# Patient Record
Sex: Male | Born: 1960 | Race: White | Hispanic: No | Marital: Married | Smoking: Never smoker
Health system: Southern US, Community
[De-identification: ages and names within clinical notes are randomized; demographics above are authoritative.]

## PROBLEM LIST (undated history)

## (undated) DIAGNOSIS — E119 Type 2 diabetes mellitus without complications: Secondary | ICD-10-CM

## (undated) DIAGNOSIS — I429 Cardiomyopathy, unspecified: Secondary | ICD-10-CM

## (undated) DIAGNOSIS — N19 Unspecified kidney failure: Secondary | ICD-10-CM

## (undated) DIAGNOSIS — I2510 Atherosclerotic heart disease of native coronary artery without angina pectoris: Secondary | ICD-10-CM

## (undated) DIAGNOSIS — N179 Acute kidney failure, unspecified: Secondary | ICD-10-CM

## (undated) DIAGNOSIS — N051 Unspecified nephritic syndrome with focal and segmental glomerular lesions: Secondary | ICD-10-CM

## (undated) DIAGNOSIS — G4730 Sleep apnea, unspecified: Secondary | ICD-10-CM

## (undated) DIAGNOSIS — IMO0002 Reserved for concepts with insufficient information to code with codable children: Secondary | ICD-10-CM

## (undated) DIAGNOSIS — I255 Ischemic cardiomyopathy: Secondary | ICD-10-CM

## (undated) DIAGNOSIS — I10 Essential (primary) hypertension: Secondary | ICD-10-CM

## (undated) DIAGNOSIS — E7800 Pure hypercholesterolemia, unspecified: Secondary | ICD-10-CM

## (undated) DIAGNOSIS — L723 Sebaceous cyst: Secondary | ICD-10-CM

## (undated) DIAGNOSIS — K2990 Gastroduodenitis, unspecified, without bleeding: Secondary | ICD-10-CM

## (undated) DIAGNOSIS — K2970 Gastritis, unspecified, without bleeding: Secondary | ICD-10-CM

## (undated) HISTORY — DX: Unspecified nephritic syndrome with focal and segmental glomerular lesions: N05.1

## (undated) HISTORY — DX: Sebaceous cyst: L72.3

## (undated) HISTORY — DX: Acute kidney failure, unspecified: N17.9

## (undated) HISTORY — DX: Gastroduodenitis, unspecified, without bleeding: K29.90

## (undated) HISTORY — DX: Cardiomyopathy, unspecified: I42.9

## (undated) HISTORY — DX: Type 2 diabetes mellitus without complications: E11.9

## (undated) HISTORY — DX: Pure hypercholesterolemia, unspecified: E78.00

## (undated) HISTORY — DX: Essential (primary) hypertension: I10

## (undated) HISTORY — DX: Sleep apnea, unspecified: G47.30

## (undated) HISTORY — DX: Unspecified kidney failure: N19

## (undated) HISTORY — DX: Atherosclerotic heart disease of native coronary artery without angina pectoris: I25.10

## (undated) HISTORY — DX: Ischemic cardiomyopathy: I25.5

## (undated) HISTORY — PX: CORONARY STENT PLACEMENT: SHX1402

## (undated) HISTORY — DX: Gastritis, unspecified, without bleeding: K29.70

## (undated) HISTORY — DX: Reserved for concepts with insufficient information to code with codable children: IMO0002

---

## 1996-06-05 DIAGNOSIS — E119 Type 2 diabetes mellitus without complications: Secondary | ICD-10-CM

## 1996-06-05 HISTORY — DX: Type 2 diabetes mellitus without complications: E11.9

## 1998-06-05 HISTORY — PX: UVULOPALATOPHARYNGOPLASTY (UPPP)/TONSILLECTOMY/SEPTOPLASTY: SHX6164

## 2011-06-06 DIAGNOSIS — N179 Acute kidney failure, unspecified: Secondary | ICD-10-CM

## 2011-06-06 DIAGNOSIS — IMO0002 Reserved for concepts with insufficient information to code with codable children: Secondary | ICD-10-CM

## 2011-06-06 HISTORY — PX: ACNE CYST REMOVAL: SUR1112

## 2011-06-06 HISTORY — PX: CARDIAC CATHETERIZATION: SHX172

## 2011-06-06 HISTORY — DX: Acute kidney failure, unspecified: N17.9

## 2011-06-06 HISTORY — DX: Reserved for concepts with insufficient information to code with codable children: IMO0002

## 2011-09-28 ENCOUNTER — Encounter: Payer: Self-pay | Admitting: *Deleted

## 2011-10-03 ENCOUNTER — Ambulatory Visit (INDEPENDENT_AMBULATORY_CARE_PROVIDER_SITE_OTHER): Payer: 59 | Admitting: Cardiovascular Disease

## 2011-10-03 ENCOUNTER — Encounter: Payer: Self-pay | Admitting: Cardiovascular Disease

## 2011-10-03 VITALS — BP 152/90 | HR 55 | Ht 74.0 in | Wt 289.0 lb

## 2011-10-03 DIAGNOSIS — I10 Essential (primary) hypertension: Secondary | ICD-10-CM | POA: Insufficient documentation

## 2011-10-03 DIAGNOSIS — G4730 Sleep apnea, unspecified: Secondary | ICD-10-CM | POA: Insufficient documentation

## 2011-10-03 DIAGNOSIS — I2510 Atherosclerotic heart disease of native coronary artery without angina pectoris: Secondary | ICD-10-CM | POA: Insufficient documentation

## 2011-10-03 DIAGNOSIS — E7800 Pure hypercholesterolemia, unspecified: Secondary | ICD-10-CM | POA: Insufficient documentation

## 2011-10-03 DIAGNOSIS — R0600 Dyspnea, unspecified: Secondary | ICD-10-CM

## 2011-10-03 MED ORDER — SPIRONOLACTONE 25 MG PO TABS: 25.0000 mg | ORAL_TABLET | Freq: Every day | ORAL | Status: DC

## 2011-10-03 MED ORDER — CARVEDILOL 12.5 MG PO TABS: 12.5000 mg | ORAL_TABLET | Freq: Two times a day (BID) | ORAL | Status: DC

## 2011-10-03 NOTE — Assessment & Plan Note (Signed)
The patient reports mild dyspnea with lower extremity edema. I suspect that he has some degree of diastolic dysfunction. He has a left bundle branch block on his EKG which is likely old. I will request an echocardiogram for further evaluation.

## 2011-10-03 NOTE — Assessment & Plan Note (Signed)
Patient seems to be stable overall with no evidence of angina. I will request his cardiac records from his previous cardiologist in [place].  Continue current medical therapy.

## 2011-10-03 NOTE — Assessment & Plan Note (Signed)
Based on his clinical symptoms, the patient likely has sleep apnea. This is likely contributing to his uncontrolled hypertension and diastolic dysfunction. Consider evaluation with a sleep study in the near future.

## 2011-10-03 NOTE — Progress Notes (Signed)
HPI  This is a 51 year old male who is here today to establish cardiovascular care. He moved recently from [place]. He got married recently and his wife is from [place]. He has extensive medical problems that include coronary artery disease. He had 2 previous stents done most recently was 6 or 7 years ago. No cardiac events since then. It appears that he continued to have angina and has been treated with Ranexa. He also has history of type 2 diabetes and prolonged history of hypertension which according to him was diagnosed when he was 51 years old. He has strong family history of hypertension and diabetes. He also has hyperlipidemia and obesity. He was diagnosed with sleep apnea in the past but he had surgery done to correct that. He has not been tested for this recently. He denies any chest pain. However, he does complain of exertional dyspnea and lower extremity edema. The edema became noticeable over the last 2 years when he was started on minoxidil. According to his wife, he snores loudly at night and his breathing stops for a few seconds. The patient has been working on weight loss and lost about 30 pounds over the last 6 months.  No Known Allergies   Current Outpatient Prescriptions on File Prior to Visit  Medication Sig Dispense Refill  . amLODipine (NORVASC) 10 MG tablet Take 10 mg by mouth daily.      Marland Kitchen aspirin 81 MG tablet Take 81 mg by mouth daily.      . clopidogrel (PLAVIX) 75 MG tablet Take 75 mg by mouth daily.      . furosemide (LASIX) 80 MG tablet Take 80 mg by mouth daily.      Marland Kitchen glipiZIDE (GLUCOTROL XL) 2.5 MG 24 hr tablet Take 2.5 mg by mouth daily.      . isosorbide mononitrate (IMDUR) 30 MG 24 hr tablet Take 30 mg by mouth daily.      Marland Kitchen lisinopril (PRINIVIL,ZESTRIL) 40 MG tablet Take 40 mg by mouth daily.      Marland Kitchen losartan (COZAAR) 100 MG tablet Take 100 mg by mouth daily.      . metFORMIN (GLUCOPHAGE) 500 MG tablet Take 500 mg by mouth 2 (two) times daily with a  meal.      . potassium chloride SA (K-DUR,KLOR-CON) 20 MEQ tablet Take 20 mEq by mouth daily.       . pravastatin (PRAVACHOL) 40 MG tablet Take 80 mg by mouth daily.       . ranitidine (ZANTAC) 150 MG tablet Take 150 mg by mouth 2 (two) times daily.      . ranolazine (RANEXA) 500 MG 12 hr tablet Take 500 mg by mouth 2 (two) times daily.      . carvedilol (COREG) 12.5 MG tablet Take 1 tablet (12.5 mg total) by mouth 2 (two) times daily.  60 tablet  1  . spironolactone (ALDACTONE) 25 MG tablet Take 1 tablet (25 mg total) by mouth daily.  30 tablet  1     Past Medical History  Diagnosis Date  . Unspecified gastritis and gastroduodenitis without mention of hemorrhage   . Sebaceous cyst     multiple on face, scalp, and trunk (side, left) per Dr Juanetta Gosling 09/25/11, Referral to [facility]  . DM type 2 (diabetes mellitus, type 2) 1998  . Coronary artery disease     2 previous stent. Most recent was in 2006  . Hypercholesterolemia   . HTN (hypertension)  since he was 51 y/o ?!!  . Sleep apnea     s/p ENT surgery more than 10 years ago.     Past Surgical History  Procedure Date  . Coronary stent placement 2002, 2006    coronary artery stent  . Cardiac catheterization      Family History  Problem Relation Age of Onset  . Heart disease Father   . Heart disease Brother      History   Social History  . Marital Status: Married    Spouse Name: N/A    Number of Children: N/A  . Years of Education: N/A   Occupational History  . Not on file.   Social History Main Topics  . Smoking status: Never Smoker   . Smokeless tobacco: Current User  . Alcohol Use: Yes     once or twice weekly  . Drug Use: No  . Sexually Active: Not on file   Other Topics Concern  . Not on file   Social History Narrative  . No narrative on file     ROS Constitutional: Negative for fever, chills, diaphoresis, activity change, appetite change.  HENT: Negative for hearing loss, nosebleeds,  congestion, sore throat, facial swelling, drooling, trouble swallowing, neck pain, voice change, sinus pressure and tinnitus.  Eyes: Negative for photophobia, pain, discharge and visual disturbance.  Respiratory: Negative for apnea, cough, chest tightness and wheezing.  Cardiovascular: Negative for chest pain, palpitations.  Gastrointestinal: Negative for nausea, vomiting, abdominal pain, diarrhea, constipation, blood in stool and abdominal distention.  Genitourinary: Negative for dysuria, urgency, frequency, hematuria and decreased urine volume.  Musculoskeletal: Negative for myalgias, back pain, joint swelling, arthralgias and gait problem.  Skin: Negative for color change, pallor, rash and wound.  Neurological: Negative for dizziness, tremors, seizures, syncope, speech difficulty, weakness, light-headedness, numbness and headaches.  Psychiatric/Behavioral: Negative for suicidal ideas, hallucinations, behavioral problems and agitation. The patient is not nervous/anxious.     PHYSICAL EXAM   BP 152/90  Pulse 55  Ht 6\' 2"  (1.88 m)  Wt 289 lb (131.09 kg)  BMI 37.11 kg/m2 Constitutional: He is oriented to person, place, and time. He appears well-developed and well-nourished. No distress.  HENT: No nasal discharge.  Head: Normocephalic and atraumatic.  Eyes: Pupils are equal and round. Right eye exhibits no discharge. Left eye exhibits no discharge.  Neck: Normal range of motion. Neck supple. No JVD present. No thyromegaly present.  Cardiovascular: Normal rate, regular rhythm, normal heart sounds and. Exam reveals no gallop and no friction rub. No murmur heard.  Pulmonary/Chest: Effort normal and breath sounds normal. No stridor. No respiratory distress. He has no wheezes. He has no rales. He exhibits no tenderness.  Abdominal: Soft. Bowel sounds are normal. He exhibits no distension. There is no tenderness. There is no rebound and no guarding.  Musculoskeletal: Normal range of motion. He  exhibits +1 edema and no tenderness.  Neurological: He is alert and oriented to person, place, and time. Coordination normal.  Skin: Skin is warm and dry. No rash noted. He is not diaphoretic. No erythema. No pallor.  Psychiatric: He has a normal mood and affect. His behavior is normal. Judgment and thought content normal.       EKG: Sinus bradycardia with left bundle branch block.   ASSESSMENT AND PLAN

## 2011-10-03 NOTE — Assessment & Plan Note (Signed)
His recent lipid profile showed a total cholesterol of 201, triglyceride 105, HDL 37 and LDL of 143. It appears that since then he was started on pravastatin which was subsequently increased to 80 mg once daily. Due to his known history of coronary artery disease, I recommend a target LDL is less than 70.

## 2011-10-03 NOTE — Assessment & Plan Note (Signed)
The patient seems to have refractory hypertension in spite of multiple medications. I think in his situation there are 2 conditions that would need to be evaluated as a culprit for his elevated blood pressure. First is renal artery stenosis and second is untreated sleep apnea.  Thus, I recommend renal duplex ultrasound. He would also need to be referred for a sleep study in the near future. Meanwhile, there are some opportunities to make changes in his medications. Minoxidil is likely responsible for his lower extremity edema and fluid retention in the setting of treatment with amlodipine. Also, metoprolol does not usually have a good antihypertensive effect and does not have a favorable metabolic profile in diabetics. Due to that, I recommend stopping minoxidil and metoprolol. Instead, I will switch him to carvedilol 12.5 mg twice daily and add spironolactone 25 mg once daily. I will obtain basic metabolic profile in one week. I asked him to decrease potassium supplements to once a day. Once his fluid retention improves, we might consider switching furosemide to a thiazide diuretic.

## 2011-10-03 NOTE — Patient Instructions (Addendum)
Stop taking Minoxidil Stop Metoprolol.  Decrease Potassium to once daily  Needs labs done in 1 week.  Start Carvedilol (Coreg) 12.5 mg twice daily.  Start Spironolactone (Aldactone) 25 mg once daily.   Your physician has requested that you have an echocardiogram. Echocardiography is a painless test that uses sound waves to create images of your heart. It provides your doctor with information about the size and shape of your heart and how well your heart's chambers and valves are working. This procedure takes approximately one hour. There are no restrictions for this procedure.  Your physician has requested that you have a renal artery duplex. During this test, an ultrasound is used to evaluate blood flow to the kidneys. Allow one hour for this exam. Do not eat after midnight the day before and avoid carbonated beverages. Take your medications as you usually do.  Follow up after tests.

## 2011-10-11 ENCOUNTER — Other Ambulatory Visit (INDEPENDENT_AMBULATORY_CARE_PROVIDER_SITE_OTHER): Payer: 59

## 2011-10-11 ENCOUNTER — Other Ambulatory Visit: Payer: Self-pay

## 2011-10-11 DIAGNOSIS — R0602 Shortness of breath: Secondary | ICD-10-CM

## 2011-10-11 DIAGNOSIS — I10 Essential (primary) hypertension: Secondary | ICD-10-CM

## 2011-10-11 DIAGNOSIS — I059 Rheumatic mitral valve disease, unspecified: Secondary | ICD-10-CM

## 2011-10-24 ENCOUNTER — Other Ambulatory Visit: Payer: 59

## 2011-11-07 ENCOUNTER — Other Ambulatory Visit (INDEPENDENT_AMBULATORY_CARE_PROVIDER_SITE_OTHER): Payer: 59

## 2011-11-07 ENCOUNTER — Encounter (INDEPENDENT_AMBULATORY_CARE_PROVIDER_SITE_OTHER): Payer: 59

## 2011-11-07 DIAGNOSIS — I10 Essential (primary) hypertension: Secondary | ICD-10-CM

## 2011-11-07 DIAGNOSIS — I2510 Atherosclerotic heart disease of native coronary artery without angina pectoris: Secondary | ICD-10-CM

## 2011-11-08 LAB — BASIC METABOLIC PANEL
Calcium: 9.8 mg/dL (ref 8.7–10.2)
GFR calc Af Amer: 63 mL/min/{1.73_m2} (ref >59)
GFR calc non Af Amer: 54 mL/min/{1.73_m2} — ABNORMAL LOW (ref >59)
Potassium: 5.2 mmol/L (ref 3.5–5.2)
Sodium: 137 mmol/L (ref 134–144)

## 2011-11-13 ENCOUNTER — Encounter: Payer: Self-pay | Admitting: Cardiovascular Disease

## 2011-11-13 ENCOUNTER — Ambulatory Visit (INDEPENDENT_AMBULATORY_CARE_PROVIDER_SITE_OTHER): Payer: 59 | Admitting: Cardiovascular Disease

## 2011-11-13 VITALS — BP 110/80 | HR 76 | Ht 74.0 in | Wt 276.0 lb

## 2011-11-13 DIAGNOSIS — I2510 Atherosclerotic heart disease of native coronary artery without angina pectoris: Secondary | ICD-10-CM

## 2011-11-13 DIAGNOSIS — R0600 Dyspnea, unspecified: Secondary | ICD-10-CM

## 2011-11-13 DIAGNOSIS — I10 Essential (primary) hypertension: Secondary | ICD-10-CM

## 2011-11-13 NOTE — Patient Instructions (Addendum)
Stop taking Lasix (Furosemide). If you develop fluid build up, resume Furosemide 1/2 tablet (40 mg ) once daily.  Decrease Losartan to 1/2 tablet (50 mg) once daily .  You need to have labs done in 1 week to check kidney function.   Follow up in 1 month.

## 2011-11-16 NOTE — Assessment & Plan Note (Signed)
His blood pressure improved after recent changes in his medications. He is mildly dizzy and his labs showed mild volume depletion. Thus, I asked him to stop taking Lasix and monitor his lower extremity edema. If he develops recurrent fluid retention, Lasix can be resumed at a smaller dose 40 mg once daily. He is also on both ACE inhibitor and an ARB with mild hyperkalemia at 5.2. He is also on spironolactone. Thus, I will decrease the dose of losartan 50 mg once daily. The next step would be to stop losartan altogether and increase carvedilol if further blood pressure control is needed. His renal duplex ultrasound showed no evidence of renal artery stenosis.

## 2011-11-16 NOTE — Progress Notes (Signed)
HPI  This is a 51 year old male who is here for a followup visit. He has extensive medical problems that include coronary artery disease. He had 2 previous stents done most recently was 6 or 7 years ago. No cardiac events since then. It appears that he continued to have angina and has been treated with Ranexa. He also has history of type 2 diabetes and prolonged history of hypertension which according to him was diagnosed when he was 51 years old. He has strong family history of hypertension and diabetes. He also has hyperlipidemia and obesity. He was diagnosed with sleep apnea in the past but he had surgery done to correct that. He has not been tested for this recently. He denies any chest pain.  During his last visit, he was noted to have significant lower extremity edema in spite of being on high-dose Lasix. I made some changes to his medications that include stopping minoxidil, switching metoprolol to carvedilol and adding Aldactone. He lost 13 pounds since then with significant improvement in his lower extremity edema. His dyspnea has improved but does complain of increased dizziness especially when he stands up.  No Known Allergies   Current Outpatient Prescriptions on File Prior to Visit  Medication Sig Dispense Refill  . amLODipine (NORVASC) 10 MG tablet Take 10 mg by mouth daily.      Marland Kitchen aspirin 81 MG tablet Take 81 mg by mouth daily.      . carvedilol (COREG) 12.5 MG tablet Take 1 tablet (12.5 mg total) by mouth 2 (two) times daily.  60 tablet  1  . clopidogrel (PLAVIX) 75 MG tablet Take 75 mg by mouth daily.      . furosemide (LASIX) 80 MG tablet Take 80 mg by mouth daily.      Marland Kitchen glipiZIDE (GLUCOTROL XL) 2.5 MG 24 hr tablet Take 2.5 mg by mouth daily.      . isosorbide mononitrate (IMDUR) 30 MG 24 hr tablet Take 30 mg by mouth daily.      Marland Kitchen lisinopril (PRINIVIL,ZESTRIL) 40 MG tablet Take 40 mg by mouth daily.      Marland Kitchen losartan (COZAAR) 100 MG tablet Take 100 mg by mouth daily.      .  metFORMIN (GLUCOPHAGE) 500 MG tablet Take 500 mg by mouth 2 (two) times daily with a meal.      . potassium chloride SA (K-DUR,KLOR-CON) 20 MEQ tablet Take 20 mEq by mouth daily.       . ranitidine (ZANTAC) 150 MG tablet Take 150 mg by mouth 2 (two) times daily.      . ranolazine (RANEXA) 500 MG 12 hr tablet Take 500 mg by mouth 2 (two) times daily.      Marland Kitchen spironolactone (ALDACTONE) 25 MG tablet Take 1 tablet (25 mg total) by mouth daily.  30 tablet  1     Past Medical History  Diagnosis Date  . Unspecified gastritis and gastroduodenitis without mention of hemorrhage   . Sebaceous cyst     multiple on face, scalp, and trunk (side, left) per Dr Juanetta Gosling 09/25/11, Referral to [facility]  . DM type 2 (diabetes mellitus, type 2) 1998  . Coronary artery disease     2 previous stent. Most recent was in 2006  . Hypercholesterolemia   . HTN (hypertension)     since he was 51 y/o ?!!  . Sleep apnea     s/p ENT surgery more than 10 years ago.  . Cyst 2013  Removed from scalp & eye     Past Surgical History  Procedure Date  . Coronary stent placement 2002, 2006    coronary artery stent  . Cardiac catheterization      Family History  Problem Relation Age of Onset  . Heart disease Father   . Heart disease Brother      History   Social History  . Marital Status: Married    Spouse Name: N/A    Number of Children: N/A  . Years of Education: N/A   Occupational History  . Not on file.   Social History Main Topics  . Smoking status: Never Smoker   . Smokeless tobacco: Current User    Types: Chew  . Alcohol Use: Yes     once or twice weekly  . Drug Use: No  . Sexually Active: Not on file   Other Topics Concern  . Not on file   Social History Narrative  . No narrative on file      PHYSICAL EXAM   BP 110/80  Pulse 76  Ht 6\' 2"  (1.88 m)  Wt 276 lb (125.193 kg)  BMI 35.44 kg/m2 Constitutional: He is oriented to person, place, and time. He appears  well-developed and well-nourished. No distress.  HENT: No nasal discharge.  Head: Normocephalic and atraumatic.  Eyes: Pupils are equal and round. Right eye exhibits no discharge. Left eye exhibits no discharge.  Neck: Normal range of motion. Neck supple. No JVD present. No thyromegaly present.  Cardiovascular: Normal rate, regular rhythm, normal heart sounds and. Exam reveals no gallop and no friction rub. No murmur heard.  Pulmonary/Chest: Effort normal and breath sounds normal. No stridor. No respiratory distress. He has no wheezes. He has no rales. He exhibits no tenderness.  Abdominal: Soft. Bowel sounds are normal. He exhibits no distension. There is no tenderness. There is no rebound and no guarding.  Musculoskeletal: Normal range of motion. He exhibits trace edema and no tenderness.  Neurological: He is alert and oriented to person, place, and time. Coordination normal.  Skin: Skin is warm and dry. No rash noted. He is not diaphoretic. No erythema. No pallor.  Psychiatric: He has a normal mood and affect. His behavior is normal. Judgment and thought content normal.       ASSESSMENT AND PLAN

## 2011-11-16 NOTE — Assessment & Plan Note (Signed)
No symptoms suggestive of angina. Continue medical therapy. 

## 2011-11-16 NOTE — Assessment & Plan Note (Signed)
His dyspnea has improved after recent adjustment in his medications. Echocardiogram showed mildly dilated left ventricle with normal LV systolic function with mild mitral and aortic insufficiency.  A mitral chordal rupture could not be excluded but there was only mild regurgitation. Thus, this will be monitored for now with likely repeat echocardiogram in one year.

## 2011-11-20 ENCOUNTER — Other Ambulatory Visit (INDEPENDENT_AMBULATORY_CARE_PROVIDER_SITE_OTHER): Payer: 59

## 2011-11-20 DIAGNOSIS — I10 Essential (primary) hypertension: Secondary | ICD-10-CM

## 2011-11-21 LAB — BASIC METABOLIC PANEL
GFR calc Af Amer: 80 mL/min/{1.73_m2} (ref >59)
GFR calc non Af Amer: 69 mL/min/{1.73_m2} (ref >59)
Potassium: 4.8 mmol/L (ref 3.5–5.2)
Sodium: 133 mmol/L — ABNORMAL LOW (ref 134–144)

## 2011-11-28 ENCOUNTER — Other Ambulatory Visit: Payer: Self-pay | Admitting: Cardiovascular Disease

## 2011-11-28 MED ORDER — SPIRONOLACTONE 25 MG PO TABS: 25.0000 mg | ORAL_TABLET | Freq: Every day | ORAL | Status: DC

## 2011-11-28 MED ORDER — CARVEDILOL 12.5 MG PO TABS: 12.5000 mg | ORAL_TABLET | Freq: Two times a day (BID) | ORAL | Status: DC

## 2011-11-28 NOTE — Telephone Encounter (Signed)
PLEASE ONLY SEND A 30 day in to [facility]

## 2011-11-28 NOTE — Telephone Encounter (Signed)
Refilled Carvedilol and spironolactone.

## 2011-12-18 ENCOUNTER — Ambulatory Visit: Payer: 59 | Admitting: Cardiovascular Disease

## 2011-12-19 ENCOUNTER — Ambulatory Visit: Payer: 59 | Admitting: Cardiovascular Disease

## 2011-12-23 ENCOUNTER — Telehealth: Payer: Self-pay | Admitting: Physician Assistant

## 2011-12-23 ENCOUNTER — Inpatient Hospital Stay (HOSPITAL_COMMUNITY)
Admission: EM | Admit: 2011-12-23 | Discharge: 2011-12-25 | DRG: 683 | Disposition: A | Discharge status: Home / Self Care | Payer: 59 | Attending: Internal Medicine | Admitting: Internal Medicine

## 2011-12-23 ENCOUNTER — Encounter (HOSPITAL_COMMUNITY): Payer: Self-pay | Admitting: Physical Medicine and Rehabilitation

## 2011-12-23 DIAGNOSIS — Z7982 Long term (current) use of aspirin: Secondary | ICD-10-CM

## 2011-12-23 DIAGNOSIS — E7800 Pure hypercholesterolemia, unspecified: Secondary | ICD-10-CM | POA: Diagnosis present

## 2011-12-23 DIAGNOSIS — N19 Unspecified kidney failure: Secondary | ICD-10-CM | POA: Diagnosis present

## 2011-12-23 DIAGNOSIS — E785 Hyperlipidemia, unspecified: Secondary | ICD-10-CM | POA: Diagnosis present

## 2011-12-23 DIAGNOSIS — I10 Essential (primary) hypertension: Secondary | ICD-10-CM | POA: Diagnosis present

## 2011-12-23 DIAGNOSIS — N179 Acute kidney failure, unspecified: Principal | ICD-10-CM | POA: Diagnosis present

## 2011-12-23 DIAGNOSIS — E119 Type 2 diabetes mellitus without complications: Secondary | ICD-10-CM | POA: Diagnosis present

## 2011-12-23 DIAGNOSIS — E871 Hypo-osmolality and hyponatremia: Secondary | ICD-10-CM | POA: Diagnosis present

## 2011-12-23 DIAGNOSIS — K2990 Gastroduodenitis, unspecified, without bleeding: Secondary | ICD-10-CM | POA: Diagnosis present

## 2011-12-23 DIAGNOSIS — Z79899 Other long term (current) drug therapy: Secondary | ICD-10-CM

## 2011-12-23 DIAGNOSIS — E875 Hyperkalemia: Secondary | ICD-10-CM | POA: Diagnosis present

## 2011-12-23 DIAGNOSIS — K2970 Gastritis, unspecified, without bleeding: Secondary | ICD-10-CM | POA: Diagnosis present

## 2011-12-23 DIAGNOSIS — I2510 Atherosclerotic heart disease of native coronary artery without angina pectoris: Secondary | ICD-10-CM | POA: Diagnosis present

## 2011-12-23 LAB — URINALYSIS, MICROSCOPIC ONLY
Bilirubin Urine: NEGATIVE
Glucose, UA: NEGATIVE mg/dL
Hgb urine dipstick: NEGATIVE
Ketones, ur: NEGATIVE mg/dL
Leukocytes, UA: NEGATIVE
Nitrite: NEGATIVE
Protein, ur: 30 mg/dL — AB
Specific Gravity, Urine: 1.02 (ref 1.005–1.030)
Urobilinogen, UA: 0.2 mg/dL (ref 0.0–1.0)
pH: 5 (ref 5.0–8.0)

## 2011-12-23 LAB — CBC
HCT: 38.7 % — ABNORMAL LOW (ref 39.0–52.0)
MCH: 30.4 pg (ref 26.0–34.0)
MCHC: 36.2 g/dL — ABNORMAL HIGH (ref 30.0–36.0)
MCV: 83.9 fL (ref 78.0–100.0)
RDW: 13 % (ref 11.5–15.5)

## 2011-12-23 LAB — COMPREHENSIVE METABOLIC PANEL
Albumin: 4.6 g/dL (ref 3.5–5.2)
BUN: 85 mg/dL — ABNORMAL HIGH (ref 6–23)
Creatinine, Ser: 4.87 mg/dL — ABNORMAL HIGH (ref 0.50–1.35)
Total Protein: 8.6 g/dL — ABNORMAL HIGH (ref 6.0–8.3)

## 2011-12-23 LAB — BASIC METABOLIC PANEL
BUN: 83 mg/dL — ABNORMAL HIGH (ref 6–23)
CO2: 18 mEq/L — ABNORMAL LOW (ref 19–32)
GFR calc non Af Amer: 16 mL/min — ABNORMAL LOW (ref >90)
Glucose, Bld: 120 mg/dL — ABNORMAL HIGH (ref 70–99)
Potassium: 4.4 mEq/L (ref 3.5–5.1)
Sodium: 135 mEq/L (ref 135–145)

## 2011-12-23 LAB — CARDIAC PANEL(CRET KIN+CKTOT+MB+TROPI)
Relative Index: INVALID (ref 0.0–2.5)
Troponin I: <0.3 ng/mL (ref <0.30)

## 2011-12-23 LAB — UREA NITROGEN, URINE: Urea Nitrogen, Ur: 746 mg/dL

## 2011-12-23 LAB — CREATININE, URINE, RANDOM: Creatinine, Urine: 257.86 mg/dL

## 2011-12-23 LAB — GLUCOSE, CAPILLARY: Glucose-Capillary: 138 mg/dL — ABNORMAL HIGH (ref 70–99)

## 2011-12-23 MED ORDER — SODIUM CHLORIDE 0.9 % IV BOLUS (SEPSIS)
500.0000 mL | Freq: Once | INTRAVENOUS | Status: AC
  Administered 2011-12-23: 500 mL via INTRAVENOUS

## 2011-12-23 MED ORDER — SODIUM CHLORIDE 0.9 % IV SOLN
INTRAVENOUS | Status: DC
  Administered 2011-12-23: 16:00:00 via INTRAVENOUS

## 2011-12-23 MED ORDER — SIMVASTATIN 40 MG PO TABS
40.0000 mg | ORAL_TABLET | Freq: Every day | ORAL | Status: DC
  Filled 2011-12-23: qty 1

## 2011-12-23 MED ORDER — SIMVASTATIN 20 MG PO TABS
20.0000 mg | ORAL_TABLET | Freq: Every day | ORAL | Status: DC
  Filled 2011-12-23: qty 1

## 2011-12-23 MED ORDER — SODIUM CHLORIDE 0.9 % IJ SOLN
3.0000 mL | Freq: Two times a day (BID) | INTRAMUSCULAR | Status: DC
  Administered 2011-12-25: 3 mL via INTRAVENOUS

## 2011-12-23 MED ORDER — HEPARIN SODIUM (PORCINE) 5000 UNIT/ML IJ SOLN
5000.0000 [IU] | Freq: Three times a day (TID) | INTRAMUSCULAR | Status: DC
  Administered 2011-12-23 – 2011-12-25 (×5): 5000 [IU] via SUBCUTANEOUS
  Filled 2011-12-23 (×8): qty 1

## 2011-12-23 MED ORDER — AMLODIPINE BESYLATE 10 MG PO TABS
10.0000 mg | ORAL_TABLET | Freq: Every day | ORAL | Status: DC
  Administered 2011-12-23 – 2011-12-25 (×3): 10 mg via ORAL
  Filled 2011-12-23 (×3): qty 1

## 2011-12-23 MED ORDER — RANOLAZINE ER 500 MG PO TB12: 500.0000 mg | ORAL_TABLET | Freq: Two times a day (BID) | ORAL | Status: DC

## 2011-12-23 MED ORDER — INSULIN ASPART 100 UNIT/ML ~~LOC~~ SOLN
0.0000 [IU] | Freq: Three times a day (TID) | SUBCUTANEOUS | Status: DC
  Administered 2011-12-24: 2 [IU] via SUBCUTANEOUS
  Administered 2011-12-24 – 2011-12-25 (×3): 1 [IU] via SUBCUTANEOUS

## 2011-12-23 MED ORDER — CARVEDILOL 12.5 MG PO TABS
12.5000 mg | ORAL_TABLET | Freq: Two times a day (BID) | ORAL | Status: DC
  Administered 2011-12-23 – 2011-12-25 (×4): 12.5 mg via ORAL
  Filled 2011-12-23 (×5): qty 1

## 2011-12-23 MED ORDER — SODIUM CHLORIDE 0.9 % IV SOLN
INTRAVENOUS | Status: AC
  Administered 2011-12-23: 22:00:00 via INTRAVENOUS

## 2011-12-23 MED ORDER — CLOPIDOGREL BISULFATE 75 MG PO TABS
75.0000 mg | ORAL_TABLET | Freq: Every day | ORAL | Status: DC
  Administered 2011-12-23 – 2011-12-25 (×3): 75 mg via ORAL
  Filled 2011-12-23 (×3): qty 1

## 2011-12-23 NOTE — ED Notes (Signed)
Pt presents to department for evaluation of elevated potassium of 7.2 and BUN of 84, sent from PCP for evaluation. Pt states he has been feeling very weak and run down. Also states hypotension and dizziness. Denies chest pain. Respirations unlabored. He is conscious alert and oriented x4.

## 2011-12-23 NOTE — H&P (Signed)
Date: 12/23/2011               Patient Name:  Samuel Navarro MRN: [id number]  DOB: 09-21-1960 Age / Sex: 51 y.o., male   PCP: Park Pope              Medical Service: Internal Medicine Teaching Service              Attending Physician: Dr. Rogelia Boga    First Contact: Dr. Lavena Bullion Pager: [phone]  Second Contact: Dr. Saralyn Pilar Pager: [phone]            After Hours (After 5p/  First Contact Pager: [phone]  weekends / holidays): Second Contact Pager: [phone]     Chief Complaint: Weakness and lightheadedness, abnormal labs  History of Present Illness: Patient is a 51 y.o. male with a PMHx of CAD s/p stenting (6-7 years ago), DMII well controlled with last A1c 6.2, HTN (which apparently was diagnosed at age 78), and recent intentional weight loss of 50 lbs (since January) who presents to [facility] for evaluation of 2 week history of progressively worsening generalized weakness, positional lightheadedness, specifically worsened over the last several days prior to admission. 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 This job his requiring the patient to be outside in the sun, sweating more frequently (he even is having to change his clothes during the shifts due to extent of the perspiration). Due to the hectic nature of his job, the patient is not eating or drinking regularly. He has been experiencing positional lightheadedness going from seated to standing positions as is required for his job. The patient is on a significant regimen of cardiac medications as an outpatient, including an ACE inhibitor, ARB, spironolactone, Ranexa, and Imdur. He has continued all of the above medications over the last 2 weeks despite his symptomatology. He was also previously on Lasix, which was discontinued on 10/03/2011 by his primary cardiologist in  the setting of orthostatic hypotension. On 11/13/2011, the patient's cardiologist noted him to have mildly elevated potassium level of 5.2, secondary to which the patient was instructed to decrease his losartan from 100 mg daily to 50 mg daily. He was instructed to return for repeat labs, however did not do so until the day prior to admission.  On 12/22/2011, the patient had repeat BMET performed at his PCPs office, apparently, this BMET revealed a potassium of 7.2 and BUN of 84 (from prior K. of 5.2, creatinine of 1.2 on 11/20/2011) secondary to which he was instructed to report to the ER for further evaluation. On evaluation of the [facility] ER, the patient's potassium level is 5, and creatinine is elevated at 4.87.   The patient continues to make urine, although it is darker in the less volume. He otherwise denies chest pain, palpitations, nausea, vomiting, diarrhea, abdominal pain, fevers, chills.   Review of Systems: Constitutional:  denies fever, chills, diaphoresis. Admits to decreased oral intake and increased fatigue.  HEENT: denies hearing loss, ear pain, congestion, sore throat, rhinorrhea, sneezing, neck pain, neck stiffness and tinnitus.  Respiratory: denies SOB, DOE, cough, chest tightness, and wheezing.  Cardiovascular: denies chest pain, palpitations and leg swelling.  Gastrointestinal: denies nausea, vomiting, abdominal pain, diarrhea, constipation, blood in stool.  Genitourinary: denies dysuria, urgency, frequency, hematuria, flank pain and  difficulty urinating.  Musculoskeletal: denies myalgias, joint swelling, arthralgias and gait problem.  Admits to low back pain  Skin: denies pallor, rash and wound.  Neurological: denies dizziness, seizures, syncope, focal weakness, numbness and headaches.  Admits to positional lightheadedness.  Hematological: admits to increased bruising over last 2 weeks. Denies adenopathy, personal or family bleeding history.    Current Outpatient  Medications: Medication Sig  . amLODipine (NORVASC) 10 MG tablet Take 10 mg by mouth daily.  Marland Kitchen aspirin 81 MG tablet Take 81 mg by mouth daily.  . carvedilol (COREG) 12.5 MG tablet Take 1 tablet (12.5 mg total) by mouth 2 (two) times daily.  . clopidogrel (PLAVIX) 75 MG tablet Take 75 mg by mouth daily.  Marland Kitchen glipiZIDE (GLUCOTROL XL) 2.5 MG 24 hr tablet Take 2.5 mg by mouth daily.  . isosorbide mononitrate (IMDUR) 30 MG 24 hr tablet Take 30 mg by mouth daily.  Marland Kitchen lisinopril (PRINIVIL,ZESTRIL) 40 MG tablet Take 40 mg by mouth daily.  Marland Kitchen losartan (COZAAR) 100 MG tablet Take 50 mg by mouth daily.   . metFORMIN (GLUCOPHAGE-XR) 500 MG 24 hr tablet Take 500 mg by mouth 2 (two) times daily.  . potassium chloride SA (K-DUR,KLOR-CON) 20 MEQ tablet Take 20 mEq by mouth daily.   . pravastatin (PRAVACHOL) 40 MG tablet Take 40 mg by mouth daily.  . ranolazine (RANEXA) 500 MG 12 hr tablet Take 500 mg by mouth 2 (two) times daily.  Marland Kitchen spironolactone (ALDACTONE) 25 MG tablet Take 1 tablet (25 mg total) by mouth daily.     Allergies: No Known Allergies    Past Medical History: Diagnosis Date  . Unspecified gastritis and gastroduodenitis without mention of hemorrhage   . Sebaceous cyst     multiple on face, scalp, and trunk (side, left) per Dr Juanetta Gosling 09/25/11, Referral to [facility]  . DM type 2 (diabetes mellitus, type 2) 1998    non-insulin depedent  . Coronary artery disease     2 previous stent. Most recent was in 2006  . Hypercholesterolemia   . HTN (hypertension)     since he was 51 y/o. Renal artery ultrasound (11/2011) normal renal arteries.  . Sleep apnea     s/p ENT surgery more than 10 years ago.  . Cyst 2013    Removed from scalp & eye    Past Surgical History: Procedure Date  . Coronary stent placement 2002, 2006    coronary artery stent  . Cardiac catheterization     Family History: Problem Relation Age of Onset  . Heart disease Father   . Heart disease Brother      Social History: History   Social History  . Marital Status: Married    Spouse Name: N/A    Number of Children: 1  . Years of Education: 12th grade   Occupational History  . paper delivery    Social History Main Topics  . Smoking status: Never Smoker   . Smokeless tobacco: Current User    Types: Chew  . Alcohol Use: Yes     once or twice weekly  . Drug Use: No  . Sexually Active: Not on file   Other Topics Concern  . Not on file   Social History Narrative   Lives at home with his wife.       Vital Signs: Blood pressure 118/72, pulse 58, temperature 98.3 F (36.8 C), temperature source Oral, resp. rate 20, height 6\' 2"  (1.88 m), weight 264 lb (119.75 kg), SpO2  99.00%.  Physical Exam: General: Vital signs reviewed and noted. Well-developed, well-nourished, in no acute distress; alert, appropriate and cooperative throughout examination.  Head: Normocephalic, atraumatic.  Eyes: PERRL, EOMI, No signs of anemia or jaundince.  Nose: Mucous membranes moist, not inflammed, nonerythematous.  Throat: Oropharynx nonerythematous, no exudate appreciated.   Neck: No deformities, masses, or tenderness noted. Supple, no JVD.  Lungs:  Normal respiratory effort. Clear to auscultation BL without crackles or wheezes.  Heart: RRR. S1 and S2 normal without gallop, murmur, or rubs.  Abdomen:  BS normoactive. Soft, Nondistended, non-tender.  No masses or organomegaly.  Extremities: No pretibial edema.  Neurologic: A&O X3, CN II - XII are grossly intact. Motor strength is 5/5 in the all 4 extremities, Sensations intact to light touch, Cerebellar signs negative.  Skin: No visible rashes, scars.    Lab results: Basic Metabolic Panel:  Basename 12/23/11 1150  NA 130*  K 5.0  CL 97  CO2 17*  GLUCOSE 155*  BUN 85*  CREATININE 4.87*  CALCIUM 9.6  MG --  PHOS --    Liver Function Tests:  Basename 12/23/11 1150  AST 19  ALT 12  ALKPHOS 66  BILITOT 0.6  PROT 8.6*  ALBUMIN  4.6    CBC:  Basename 12/23/11 1150  WBC 5.9  NEUTROABS --  HGB 14.0  HCT 38.7*  MCV 83.9  PLT 288    Urinalysis:  Ref. Range 12/23/2011 14:59  Color, Urine Latest Range: YELLOW  YELLOW  APPearance Latest Range: CLEAR  CLOUDY (A)  Specific Gravity, Urine Latest Range: 1.005-1.030  1.020  pH Latest Range: 5.0-8.0  5.0  Glucose Latest Range: NEGATIVE mg/dL NEGATIVE  Bilirubin Urine Latest Range: NEGATIVE  NEGATIVE  Ketones, ur Latest Range: NEGATIVE mg/dL NEGATIVE  Protein Latest Range: NEGATIVE mg/dL 30 (A)  Urobilinogen, UA Latest Range: 0.0-1.0 mg/dL 0.2  Nitrite Latest Range: NEGATIVE  NEGATIVE  Leukocytes, UA Latest Range: NEGATIVE  NEGATIVE  Hgb urine dipstick Latest Range: NEGATIVE  NEGATIVE  WBC, UA Latest Range: <3 WBC/hpf 0-2  Squamous Epithelial / LPF Latest Range: RARE  RARE  Casts Latest Range: NEGATIVE  HYALINE CASTS (A)    Miscellaneous labs:  Ref. Range 12/23/2011 14:59  Creatinine, Urine No range found 257.86    Historical Data:  2D Echo (11/2011) - LV EF of 55-65%, mild concentric hypertrophy, septal wall motion consistent with conduction abnormality. LV diastolic function parameters within normal limits.  Imaging results:  No results found.   Other results:  EKG (12/23/2011) - Normal Sinus Rhythm, regular rate of approximately 75 bpm, left axis, chronic LBBB, Q waves note in lateral leads (I, aVL).    Assessment & Plan:  Pt is a 51 y.o. yo male with a PMHx of CAD s/p stenting, non-insulin-dependent diabetes mellitus type 2, hypertension who was admitted on 12/23/2011 with symptoms of progressively worsening weakness in positional lightheadedness x2 weeks prior to admission, in addition to abnormal labs drawn from his PCPs office where the patient was noted to have an acute kidney injury, and possible hyperkalemia. On evaluation at the [facility], the patient is noted to have a AKI with a creatinine of 4.8, however, potassium levels are normal.  These acute changes are thought likely to represent significant volume depletion in the setting of recent increased manual labor in the sun, reduced oral intake, atop of ongoing diuretic usage.  1) Acute kidney injury, possibly acute on chronic kidney disease - admission serum creatinine of 4.89, from prior of 1.2  on 11/13/2011. This acute component is thought likely secondary to prerenal azotemia in the setting of significant insensible losses (working outside in the sun for 10-12 hours daily with minimal oral intake of food or fluids) and ongoing diuretic usage. However, given the duration of symptoms, it is possible that prerenal cause of AKI may have progressed to ATN. Unclear at this point. As well, given history of recent increased physical labor, dehydration, there is some concern for rhabdomyolysis - however, this is expected to be less likely given no blood in urine, no significant myalgias. Notably, the patient has had recent renal artery duplex ultrasound that was negative for any indication of renal artery stenosis. As well, there is no blood in the urine and patient has not experienced pain or difficulty with urination to suggest nephrolithiasis. Urine is otherwise also negative for evidence of infection.  Plan: - Check orthostatic vital signs. - Check urine urea and creatinine. - IV fluids with normal saline. - Continue to monitor renal function until AKI resolved. - An outpatient, after acute issues resolve, patient should have evaluation of a microalbumin to creatinine ratio to assess if he has proteinuria contributed by his long-standing diabetes and previously poorly controlled hypertension. - Check CPK x 1.  2) HTN - currently well controlled (although previously determined to be resistant hypertension), has not taken his medications in the last 1 day. Dr. Kirke Corin was also concerned about possible secondary causes of HTN, such as untreated OSA or renal artery stenosis (Renal artery [place]  negative). Unclear why patient is chronically on both on ACE-I and ARB at home in addition to Aldactone. Aldactone was added after the patient continued to experience lower extremity edema despite high-dose Lasix. - Hold spironolactone, ACE-I and ARB for now given #1. - Continue Coreg and Amlodipine at home dosage. - Continue home ranexa at reduced dosage. - Check TSH.  3) DMII, controlled, non-insulin dependent - reported an A1c of 6.2. At home, the patient is on metformin and glipizide. Given his current renal status, both of these medications will be held. - Will start sliding scale insulin during hospital course. - Will need to monitor her renal function carefully, as metformin is contraindicated above serum creatinine of 1.5 in men. As well, glipizide should be reduced by 1/2 dose for GFR < 50. - Will continue to reevaluate appropriate regimen as renal function improves.  4) CAD s/p stenting in 2002, 2006 - patient describes history of prior chronic angina for which he is on imdur and ranexa chronically and he is interested in possibly discontinuing the Ranexa. As well, the patient is on both aspirin and Plavix for DES placed in 2006, unclear why dual antiplatelet has been continued for years at this point.  - Will continue plavix at this time. - Will discontinue aspirin at present time given recent increased bruising.   5) Hyponatremia - Admission SNa of 130, likely secondary to significant AKI due to prerenal etiology. - Will treat as per #1. - Do not want to rapidly overcorrect more than 10 over 24 hours.    6) HLD - Per her cardiologist's last note on 10/03/2011, it seems that the patient's last recent lipid profile showed a total cholesterol of 201, triglyceride 105, HDL 37 and LDL of 143 - which the patient was on Pravastatin 40mg  daily. This is much higher than goal LDL < 70 mg/dL. - Will DC pravastatin. - Start Simvastatin 40mg  daily.  7) DVT PPX - heparin   Signed: Johnette Abraham,  Roma Schanz, Internal Medicine Resident Pager: [phone] (7AM-5PM) 12/23/2011, 5:05 PM

## 2011-12-23 NOTE — Telephone Encounter (Signed)
Pt wife called because pt had blood work done yesterday. She was called during the night but not aware of the call till this am. She was told his BUN was 84 and K+ 7.2. Pt generally feels bad. She is currently en route with pt by car to [place].   Advised her to let ER staff know ASAP that his K+ was 7.2 and let ER MD treat him for this. Have ER staff call us but they should treat him first.

## 2011-12-23 NOTE — ED Provider Notes (Signed)
History     CSN: [id number]  Arrival date & time 12/23/11  1053   First MD Initiated Contact with Patient 12/23/11 1230      Chief Complaint  Patient presents with  . Abnormal Lab    (Consider location/radiation/quality/duration/timing/severity/associated sxs/prior treatment) HPI Is asymptomatic 51 year old male was called because of an abnormal blood test on her routine clinic visit. He has a long-standing history of hypertension, he also has diabetes, he moved from [place] to [place] a few months ago, he has lost over 50 pounds in the last several months, his blood pressure is better controlled in the last few months it has been years, he has no chest pain cough or shortness of breath. He has no complaints but a routine clinic visit this week he had blood drawn, he was called today by the cardiology clinic and told to come to the ED for new onset renal failure and a possible high potassium level. BUN 84 and K 7.2 at clinic test. Past Medical History  Diagnosis Date  . Unspecified gastritis and gastroduodenitis without mention of hemorrhage   . Sebaceous cyst     multiple on face, scalp, and trunk (side, left) per Dr Juanetta Gosling 09/25/11, Referral to [facility]  . DM type 2 (diabetes mellitus, type 2) 1998    non-insulin depedent  . Coronary artery disease     2 previous stent. Most recent was in 2006  . Hypercholesterolemia   . HTN (hypertension)     since he was 51 y/o ?!!  . Sleep apnea     s/p ENT surgery more than 10 years ago.  . Cyst 2013    Removed from scalp & eye    Past Surgical History  Procedure Date  . Coronary stent placement 2002, 2006    coronary artery stent  . Cardiac catheterization     Family History  Problem Relation Age of Onset  . Heart disease Father   . Heart disease Brother     History  Substance Use Topics  . Smoking status: Never Smoker   . Smokeless tobacco: Current User    Types: Chew  . Alcohol Use: Yes     once or  twice weekly      Review of Systems  Constitutional: Negative for fever.       10 Systems reviewed and are negative for acute change except as noted in the HPI.  HENT: Negative for congestion.   Eyes: Negative for discharge and redness.  Respiratory: Negative for cough and shortness of breath.   Cardiovascular: Negative for chest pain.  Gastrointestinal: Negative for vomiting and abdominal pain.  Musculoskeletal: Negative for back pain.  Skin: Negative for rash.  Neurological: Negative for syncope, numbness and headaches.  Psychiatric/Behavioral:       No behavior change.    Allergies  Review of patient's allergies indicates no known allergies.  Home Medications   No current outpatient prescriptions on file.  BP 135/79  Pulse 68  Temp 98.3 F (36.8 C) (Oral)  Resp 20  Ht 6\' 2"  (1.88 m)  Wt 264 lb (119.75 kg)  BMI 33.90 kg/m2  SpO2 99%  Physical Exam  Nursing note and vitals reviewed. Constitutional:       Awake, alert, nontoxic appearance.  HENT:  Head: Atraumatic.  Eyes: Right eye exhibits no discharge. Left eye exhibits no discharge.  Neck: Neck supple.  Cardiovascular: Normal rate and regular rhythm.   No murmur heard. Pulmonary/Chest: Effort normal and breath  sounds normal. No respiratory distress. He has no wheezes. He has no rales. He exhibits no tenderness.  Abdominal: Soft. There is no tenderness. There is no rebound.  Musculoskeletal: He exhibits no edema and no tenderness.       Baseline ROM, no obvious new focal weakness.  Neurological:       Mental status and motor strength appears baseline for patient and situation.  Skin: No rash noted.  Psychiatric: He has a normal mood and affect.    ED Course  Procedures (including critical care time) ECG: Sinus rhythm with first degree AV block, ventricular rate 75, PR interval 212 ms, left axis deviation, left bundle branch block, QRS duration 152 ms, no comparison ECG available  D/w OPC for unassigned  Med admit after d/w Cards 1350 Labs Reviewed  CBC - Abnormal; Notable for the following:    HCT 38.7 (*)     MCHC 36.2 (*)     All other components within normal limits  COMPREHENSIVE METABOLIC PANEL - Abnormal; Notable for the following:    Sodium 130 (*)     CO2 17 (*)     Glucose, Bld 155 (*)     BUN 85 (*)     Creatinine, Ser 4.87 (*)     Total Protein 8.6 (*)     GFR calc non Af Amer 13 (*)     GFR calc Af Amer 15 (*)     All other components within normal limits  URINALYSIS, WITH MICROSCOPIC - Abnormal; Notable for the following:    APPearance CLOUDY (*)     Protein, ur 30 (*)     Casts HYALINE CASTS (*)     All other components within normal limits  BASIC METABOLIC PANEL - Abnormal; Notable for the following:    CO2 18 (*)     Glucose, Bld 120 (*)     BUN 83 (*)     Creatinine, Ser 3.95 (*)     GFR calc non Af Amer 16 (*)     GFR calc Af Amer 19 (*)     All other components within normal limits  GLUCOSE, CAPILLARY - Abnormal; Notable for the following:    Glucose-Capillary 135 (*)     All other components within normal limits  GLUCOSE, CAPILLARY - Abnormal; Notable for the following:    Glucose-Capillary 138 (*)     All other components within normal limits  CREATININE, URINE, RANDOM  UREA NITROGEN, URINE  CARDIAC PANEL(CRET KIN+CKTOT+MB+TROPI)  OSMOLALITY, URINE  OSMOLALITY  TSH  HEMOGLOBIN A1C  CBC  BASIC METABOLIC PANEL   No results found.   1. Renal failure       MDM  Pt stable in ED with no significant deterioration in condition.Patient / Family / Caregiver informed of clinical course, understand medical decision-making process, and agree with plan.        Hurman Horn, MD 12/23/11 2118

## 2011-12-23 NOTE — ED Notes (Signed)
Consulting MD at bedside

## 2011-12-24 ENCOUNTER — Telehealth: Payer: Self-pay | Admitting: Physician Assistant

## 2011-12-24 LAB — CBC
HCT: 34.8 % — ABNORMAL LOW (ref 39.0–52.0)
Hemoglobin: 12.7 g/dL — ABNORMAL LOW (ref 13.0–17.0)
MCH: 30.7 pg (ref 26.0–34.0)
MCHC: 36.5 g/dL — ABNORMAL HIGH (ref 30.0–36.0)
RBC: 4.14 MIL/uL — ABNORMAL LOW (ref 4.22–5.81)

## 2011-12-24 LAB — BASIC METABOLIC PANEL
BUN: 74 mg/dL — ABNORMAL HIGH (ref 6–23)
BUN: 50 mg/dL — ABNORMAL HIGH (ref 6–23)
CO2: 21 mEq/L (ref 19–32)
CO2: 18 mEq/L — ABNORMAL LOW (ref 19–32)
Chloride: 101 mEq/L (ref 96–112)
Chloride: 102 mEq/L (ref 96–112)
Chloride: 106 mEq/L (ref 96–112)
Creatinine, Ser: 2.82 mg/dL — ABNORMAL HIGH (ref 0.50–1.35)
GFR calc non Af Amer: 53 mL/min — ABNORMAL LOW (ref >90)
Glucose, Bld: 147 mg/dL — ABNORMAL HIGH (ref 70–99)
Glucose, Bld: 218 mg/dL — ABNORMAL HIGH (ref 70–99)
Glucose, Bld: 170 mg/dL — ABNORMAL HIGH (ref 70–99)
Potassium: 4.6 mEq/L (ref 3.5–5.1)
Potassium: 5.3 mEq/L — ABNORMAL HIGH (ref 3.5–5.1)
Potassium: 4.8 mEq/L (ref 3.5–5.1)
Sodium: 135 mEq/L (ref 135–145)
Sodium: 134 mEq/L — ABNORMAL LOW (ref 135–145)

## 2011-12-24 LAB — GLUCOSE, CAPILLARY
Glucose-Capillary: 144 mg/dL — ABNORMAL HIGH (ref 70–99)
Glucose-Capillary: 130 mg/dL — ABNORMAL HIGH (ref 70–99)

## 2011-12-24 LAB — OSMOLALITY, URINE: Osmolality, Ur: 516 mOsm/kg (ref 390–1090)

## 2011-12-24 LAB — HEMOGLOBIN A1C
Hgb A1c MFr Bld: 6.8 % — ABNORMAL HIGH (ref <5.7)
Mean Plasma Glucose: 148 mg/dL — ABNORMAL HIGH (ref <117)

## 2011-12-24 MED ORDER — SIMVASTATIN 20 MG PO TABS
20.0000 mg | ORAL_TABLET | Freq: Every day | ORAL | Status: DC
  Filled 2011-12-24 (×2): qty 1

## 2011-12-24 MED ORDER — SODIUM CHLORIDE 0.9 % IV SOLN
INTRAVENOUS | Status: DC
  Administered 2011-12-24: 100 mL via INTRAVENOUS
  Administered 2011-12-25: 05:00:00 via INTRAVENOUS

## 2011-12-24 NOTE — Progress Notes (Signed)
Subjective: Patient has no complaints whatsoever today. Denies any light-headedness. Denies chest pain, shortness of breath, nausea, vomiting.  Patient and wife concerned about discontinuation of oral diabetes medications and anti-HTN being held as an inpatient, which prompted them to contact [facility] cardiology early this AM to discuss matter. Issue was addressed by Robinson, and the patient was futher counseled this AM by Dr. Rogelia Boga, who explained in great detail the rationale behind holding these medications, much to the satisfaction of the patient and wife (from whom this information regarding the helpfulness of the discussion was obtained).  No acute overnight events.   Objective: Vital signs in last 24 hours: Filed Vitals:   12/24/11 1233 12/24/11 1235 12/24/11 1237 12/24/11 1240  BP: 126/78 106/69 97/65 116/76  Pulse: 62 66 76 70  Temp:      TempSrc:      Resp:      Height:      Weight:      SpO2: 99% 98% 98% 96%   Weight change:   Intake/Output Summary (Last 24 hours) at 12/24/11 1413 Last data filed at 12/23/11 2100  Gross per 24 hour  Intake    720 ml  Output      0 ml  Net    720 ml   Physical exam: General: Awake, alert, lying in bed CV: regular rate and rhythm, no murmurs, rubs, gallops Resp: clear to auscultation bilaterally; no wheezes, rales, rhonchi Abd: soft, non-tender, non-distended; normoactive bowel sounds Ext: 2+ pulses; no pretibial edema  Lab Results: Basic Metabolic Panel:  Lab 12/24/11 1610 12/24/11 0147  NA 135 131*  K 5.3* 4.6  CL 102 101  CO2 21 17*  GLUCOSE 218* 147*  BUN 63* 74*  CREATININE 1.95* 2.82*  CALCIUM 9.6 8.8  MG -- --  PHOS -- --   Liver Function Tests:  Lab 12/23/11 1150  AST 19  ALT 12  ALKPHOS 66  BILITOT 0.6  PROT 8.6*  ALBUMIN 4.6   CBC:  Lab 12/24/11 0147 12/23/11 1150  WBC 5.2 5.9  NEUTROABS -- --  HGB 12.7* 14.0  HCT 34.8* 38.7*  MCV 84.1 83.9  PLT 254 288   Cardiac Enzymes:  Lab 12/23/11 1615    CKTOTAL 98  CKMB 3.0  CKMBINDEX --  TROPONINI <0.30   CBG:  Lab 12/24/11 1123 12/24/11 0752 12/23/11 2104 12/23/11 1718  GLUCAP 130* 144* 138* 135*   Hemoglobin A1C:  Lab 12/23/11 1615  HGBA1C 6.8*   Thyroid Function Tests:  Lab 12/23/11 1615  TSH 0.274*  T4TOTAL --  FREET4 --  T3FREE --  THYROIDAB --   Urinalysis:  Lab 12/23/11 1459  COLORURINE YELLOW  LABSPEC 1.020  PHURINE 5.0  GLUCOSEU NEGATIVE  HGBUR NEGATIVE  BILIRUBINUR NEGATIVE  KETONESUR NEGATIVE  PROTEINUR 30*  UROBILINOGEN 0.2  NITRITE NEGATIVE  LEUKOCYTESUR NEGATIVE   Studies/Results: No results found. Medications: I have reviewed the patient's current medications. Scheduled Meds:   . amLODipine  10 mg Oral Daily  . carvedilol  12.5 mg Oral BID  . clopidogrel  75 mg Oral Daily  . heparin  5,000 Units Subcutaneous Q8H  . insulin aspart  0-9 Units Subcutaneous TID WC  . simvastatin  20 mg Oral q1800  . sodium chloride  500 mL Intravenous Once  . sodium chloride  3 mL Intravenous Q12H  . DISCONTD: ranolazine  500 mg Oral BID  . DISCONTD: simvastatin  20 mg Oral q1800  . DISCONTD: simvastatin  40 mg Oral q1800  Continuous Infusions:   . sodium chloride 150 mL/hr at 12/23/11 2130  . sodium chloride 100 mL (12/24/11 1304)  . DISCONTD: sodium chloride 175 mL/hr at 12/23/11 1604   PRN Meds:. Assessment/Plan: Pt is a 51 y.o. yo male with a PMHx of CAD s/p stenting, non-insulin-dependent diabetes mellitus type 2, hypertension who was admitted on 12/23/2011 with symptoms of progressively worsening weakness in positional lightheadedness x2 weeks prior to admission, in addition to abnormal labs drawn from his PCPs office where the patient was noted to have an acute kidney injury, and possible hyperkalemia. Cr of 4.8 and K+ of 5.0 on presentation.   1) Acute kidney injury, possibly acute on chronic kidney disease - Current Cr = 1.95. Admission serum creatinine of 4.89, from prior of 1.2 on 11/13/2011.  Likely secondary to prerenal azotemia, but will monitor for ATN. Recent significant increase in physical activity might also be contributing towards increased serum creatinine, and is also the cause of the patient's volume deficit (2/2 excessive sweating and inadequate PO intake). CPK negative. Urine urea, osmolality, and creatinine all within normal limits. Orthostatics positive today. - IV fluids with normal saline.  - Continue to monitor renal function until AKI resolved.  - Check microalbumin/Cr ratio in outpatient setting  2) HTN - well-controlled during hospital course thus far. Patient currently has orthostatic hypotension. TSH slightly decreased. - Continue to hold spironolactone, ACE-I and ARB for now given #1.  - Continue Coreg and Amlodipine at home dosage.   - Outpatient follow-up for decreased TSH after d/c  3) DMII, controlled, non-insulin dependent - reported A1c 6.2. Glucose relatively well controlled on SSI (highest random glucose = 218) - continue SSI - Monitor renal fxn carefully, as metformin is contraindicated above serum creatinine of 1.5 in men. As well, glipizide should be reduced by 1/2 dose for GFR < 50.  - Will continue to reevaluate appropriate regimen as renal function improves.   4) CAD s/p stenting in 2002, 2006 - patient describes history of prior chronic angina for which he is on imdur and ranexa chronically and he is interested in possibly discontinuing the Ranexa. As well, the patient is on both aspirin and Plavix for DES placed in 2006, unclear why dual antiplatelet has been continued for years at this point.  - Will continue plavix at this time.  - Will restart aspirin once renal function has improved to baseline  5) HLD - Per his cardiologist's last note on 10/03/2011, it seems that the patient's last recent lipid profile showed a total cholesterol of 201, triglyceride 105, HDL 37 and LDL of 143 - which the patient was on Pravastatin 40mg  daily. This is much  higher than goal LDL < 70 mg/dL. Patient started on simvastatin 20mg /day.  6) DVT PPX - heparin    LOS: 1 day   Samvel Zinn R 12/24/2011, 2:13 PM

## 2011-12-24 NOTE — Telephone Encounter (Signed)
Pt wife called because pt had been admitted last pm by IM for acute renal failure vs insufficiency. Many of his home meds held and they were concerned. His K+ had been high (7.2) as OP and she said that was OK but his BUN/Cr were high now.  Advised her that worsening renal function could make them hold a number of meds. Insulin can always be used so they go to that for diabetes control with RI. Advised her to talk to attending about her concerns, especially if she has specific questions. We have not been called to see him in-hosp but just call svc back if f/u appt with cards is needed.

## 2011-12-25 LAB — BASIC METABOLIC PANEL
BUN: 35 mg/dL — ABNORMAL HIGH (ref 6–23)
BUN: 31 mg/dL — ABNORMAL HIGH (ref 6–23)
Calcium: 9 mg/dL (ref 8.4–10.5)
Calcium: 9.1 mg/dL (ref 8.4–10.5)
Creatinine, Ser: 1.16 mg/dL (ref 0.50–1.35)
Creatinine, Ser: 1.06 mg/dL (ref 0.50–1.35)
GFR calc Af Amer: 69 mL/min — ABNORMAL LOW (ref >90)
GFR calc Af Amer: 83 mL/min — ABNORMAL LOW (ref >90)
GFR calc Af Amer: >90 mL/min (ref >90)
GFR calc non Af Amer: 60 mL/min — ABNORMAL LOW (ref >90)
GFR calc non Af Amer: 72 mL/min — ABNORMAL LOW (ref >90)
GFR calc non Af Amer: 80 mL/min — ABNORMAL LOW (ref >90)
Glucose, Bld: 134 mg/dL — ABNORMAL HIGH (ref 70–99)
Glucose, Bld: 132 mg/dL — ABNORMAL HIGH (ref 70–99)
Potassium: 4.7 mEq/L (ref 3.5–5.1)
Potassium: 4.7 mEq/L (ref 3.5–5.1)
Sodium: 134 mEq/L — ABNORMAL LOW (ref 135–145)
Sodium: 137 mEq/L (ref 135–145)

## 2011-12-25 LAB — GLUCOSE, CAPILLARY
Glucose-Capillary: 115 mg/dL — ABNORMAL HIGH (ref 70–99)
Glucose-Capillary: 131 mg/dL — ABNORMAL HIGH (ref 70–99)

## 2011-12-25 MED ORDER — METFORMIN HCL ER 750 MG PO TB24: 750.0000 mg | ORAL_TABLET | Freq: Every day | ORAL | Status: DC

## 2011-12-25 MED ORDER — LISINOPRIL 40 MG PO TABS
40.0000 mg | ORAL_TABLET | Freq: Every day | ORAL | Status: DC
  Administered 2011-12-25: 40 mg via ORAL
  Filled 2011-12-25: qty 1

## 2011-12-25 MED ORDER — METFORMIN HCL ER 750 MG PO TB24
750.0000 mg | ORAL_TABLET | Freq: Every day | ORAL | Status: DC
  Filled 2011-12-25: qty 1

## 2011-12-25 MED ORDER — METFORMIN HCL 500 MG PO TABS: 1000.0000 mg | ORAL_TABLET | Freq: Two times a day (BID) | ORAL | Status: DC

## 2011-12-25 MED ORDER — ASPIRIN EC 81 MG PO TBEC
81.0000 mg | DELAYED_RELEASE_TABLET | Freq: Every day | ORAL | Status: DC
  Administered 2011-12-25: 81 mg via ORAL
  Filled 2011-12-25: qty 1

## 2011-12-25 NOTE — Progress Notes (Signed)
Pt discharged to home per MD order. Pt received all discharge instructions and medication information including follow-up appointments and prescriptions.  Pt alert and oriented at discharge with no complaints of pain.  Pt escorted to private vehicle via wheelchair. Efraim Kaufmann

## 2011-12-25 NOTE — Progress Notes (Signed)
Utilization review completed.  

## 2011-12-25 NOTE — Progress Notes (Signed)
Subjective: Patient has no complaints today. States that he no longer feels light-headed upon standing. Denies dizziness, SOB, chest pain, nausea, vomiting, fevers, or chills.  No acute interval events. Objective: Vital signs in last 24 hours: Filed Vitals:   12/25/11 0403 12/25/11 0406 12/25/11 1020 12/25/11 1318  BP: 125/81 125/74 150/84 146/79  Pulse: 58 68 53 69  Temp:    98.2 F (36.8 C)  TempSrc:    Oral  Resp:    18  Height:      Weight:      SpO2:    98%   Weight change:   Intake/Output Summary (Last 24 hours) at 12/25/11 1331 Last data filed at 12/25/11 0900  Gross per 24 hour  Intake    480 ml  Output      0 ml  Net    480 ml   Physical exam:  General: Awake, alert, lying in bed  CV: regular rate and rhythm, no murmurs, rubs, gallops  Resp: clear to auscultation bilaterally; no wheezes, rales, rhonchi  Abd: soft, non-tender, non-distended; normoactive bowel sounds  Ext: 2+ pulses; no pretibial edema  Lab Results: Basic Metabolic Panel:  Lab 12/25/11 1610 12/25/11 0626  NA 137 137  K 4.7 4.7  CL 106 107  CO2 20 20  GLUCOSE 150* 132*  BUN 31* 35*  CREATININE 1.06 1.16  CALCIUM 9.6 9.1  MG -- --  PHOS -- --   Liver Function Tests:  Lab 12/23/11 1150  AST 19  ALT 12  ALKPHOS 66  BILITOT 0.6  PROT 8.6*  ALBUMIN 4.6   CBC:  Lab 12/24/11 0147 12/23/11 1150  WBC 5.2 5.9  NEUTROABS -- --  HGB 12.7* 14.0  HCT 34.8* 38.7*  MCV 84.1 83.9  PLT 254 288   Cardiac Enzymes:  Lab 12/23/11 1615  CKTOTAL 98  CKMB 3.0  CKMBINDEX --  TROPONINI <0.30   CBG:  Lab 12/25/11 1134 12/25/11 0730 12/24/11 2105 12/24/11 1749 12/24/11 1123 12/24/11 0752  GLUCAP 135* 131* 115* 155* 130* 144*   Hemoglobin A1C:  Lab 12/23/11 1615  HGBA1C 6.8*   Thyroid Function Tests:  Lab 12/23/11 1615  TSH 0.274*  T4TOTAL --  FREET4 --  T3FREE --  THYROIDAB --    Urinalysis:  Lab 12/23/11 1459  COLORURINE YELLOW  LABSPEC 1.020  PHURINE 5.0  GLUCOSEU  NEGATIVE  HGBUR NEGATIVE  BILIRUBINUR NEGATIVE  KETONESUR NEGATIVE  PROTEINUR 30*  UROBILINOGEN 0.2  NITRITE NEGATIVE  LEUKOCYTESUR NEGATIVE   Medications: I have reviewed the patient's current medications. Scheduled Meds:   . aspirin EC  81 mg Oral Daily  . carvedilol  12.5 mg Oral BID  . clopidogrel  75 mg Oral Daily  . heparin  5,000 Units Subcutaneous Q8H  . insulin aspart  0-9 Units Subcutaneous TID WC  . lisinopril  40 mg Oral Daily  . metFORMIN  750 mg Oral Q breakfast  . simvastatin  20 mg Oral q1800  . sodium chloride  3 mL Intravenous Q12H  . DISCONTD: amLODipine  10 mg Oral Daily  . DISCONTD: metFORMIN  1,000 mg Oral BID WC   Continuous Infusions:   . DISCONTD: sodium chloride 100 mL/hr at 12/25/11 0430   PRN Meds:. Assessment/Plan: Pt is a 51 y.o. yo male with a PMHx of CAD s/p stenting, non-insulin-dependent diabetes mellitus type 2, hypertension who was admitted on 12/23/2011 with symptoms of progressively worsening weakness in positional lightheadedness x2 weeks prior to admission, in addition to abnormal  labs drawn from his PCPs office where the patient was noted to have an acute kidney injury, and possible hyperkalemia. Cr of 4.8 and K+ of 5.0 on presentation.   1) Acute kidney injury, possibly acute on chronic kidney disease - Current Cr = 1.06. Admission serum creatinine of 4.89, from prior of 1.2 on 11/13/2011. Issue resolved after rehydration. - d/c IVF  2) HTN - well-controlled during hospital course. Orthostatic hypotension resolved.  - simplify home med regimen  3) DMII, controlled, non-insulin dependent - A1c = 6.8. Stable. - resume metformin on increased dose on d/c; discontinue sulfonylurea (attempting to simplify regimen)  4) CAD s/p stenting in 2002, 2006 - patient describes history of prior chronic angina for which he is on imdur and ranexa chronically and he is interested in possibly discontinuing the Ranexa. As well, the patient is on both  aspirin and Plavix for DES placed in 2006, unclear why dual antiplatelet has been continued for years at this point.  - continue bblocker, lisinopril; d/c ranexa and imdur, pt will follow-up with cardiology this week after d/c  5) HLD - Per his cardiologist's last note on 10/03/2011, it seems that the patient's last recent lipid profile showed a total cholesterol of 201, triglyceride 105, HDL 37 and LDL of 143 - which the patient was on Pravastatin 40mg  daily. This is much higher than goal LDL < 70 mg/dL. Patient started on simvastatin 20mg /day. - address statin dose/potency as outpatient  6. Dispo - Home today. Patient has had his medication regimen extensively modified/pruned in effort to simplify, increase affordability, and also to prevent hyperkalemia (ARB, sprinolactone). Patient will follow-up with cardiology and also with PCP.   LOS: 2 days   Lovada Barwick R 12/25/2011, 1:31 PM

## 2011-12-25 NOTE — H&P (Signed)
Internal Medicine Teaching Service Attending Note Date: 12/25/2011  Patient name: Samuel Navarro  Medical record number: [id number]  Date of birth: 1961-02-16   I have seen and evaluated Samuel Navarro and discussed their care with the Residency Team. The patient was seen and the H&P was written on 12/24/11 but due to connectivity issues, was not entered into EPIC until the 22nd. Please see Samuel Navarro's H&P for full details. Samuel Navarro was admitted for ARF. 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   PMHx, meds, soc hx, fam hx, and allergies were reviewed  Filed Vitals:   12/25/11 0001 12/25/11 0400 12/25/11 0403 12/25/11 0406  BP: 153/82 129/79 125/81 125/74  Pulse: 60 62 58 68  Temp: 98.2 F (36.8 C) 98.4 F (36.9 C)    TempSrc:      Resp: 18 18    Height:      Weight:      SpO2: 99% 98%     GEN : obese, pleasant, lying in bed, NAD HRRR LCTAB ABD obese, NT, ND Neuro : A&O no focal  Labs and imaging reviewed  Assessment and Plan: I agree with the formulated Assessment and Plan with the following changes:   ARF - hx indicates volume contraction.  Pt's BP has been just above 100 at home and he has been dizzy after leaning over. However, predisposing factors and sxs present for up to 2 weeks so ATN is a possibility. UA did not show casts though. Pt's creatinine has responded rather quickly to IVF making pre-renal most likely. Pt remains mildly hypekalemic so IVF will be cont and K rechecked. Pt's ACEI and ARB have been held along with his spironolactone and Imdur.   HTN - Pt was dx with HTN at the age of 89 and initially started on Valium. [facility] where he underwent extensive testing but no surgical intervention. As his BP was normal / low on admit,  all but his Norvasc and BB were held. We will need to make a final decision as to meds on D/C as with his weight loss and increased exercise, he may not need as extensive a regimen. With his CAD and DM, BB and an ACEI need to be part of the regimen.   DM - metformin was obviously held on admit along with his Glucotrol. CBG's here are a bit elevated but could be stressed induced? With weight loss and increased activity, he may be able to decrease DM meds but will leave up to PCP.   Dispo - in  AM if creatinine cont to trend down and K nl. Med regimen needs decided. Has appt on the 23rd with cards.  Samuel Spain, MD 7/22/20138:31 AM

## 2011-12-26 ENCOUNTER — Encounter: Payer: Self-pay | Admitting: Cardiovascular Disease

## 2011-12-26 ENCOUNTER — Ambulatory Visit (INDEPENDENT_AMBULATORY_CARE_PROVIDER_SITE_OTHER): Payer: 59 | Admitting: Cardiovascular Disease

## 2011-12-26 VITALS — BP 138/68 | HR 66 | Ht 74.0 in | Wt 276.0 lb

## 2011-12-26 DIAGNOSIS — I10 Essential (primary) hypertension: Secondary | ICD-10-CM

## 2011-12-26 DIAGNOSIS — N19 Unspecified kidney failure: Secondary | ICD-10-CM

## 2011-12-26 DIAGNOSIS — I2510 Atherosclerotic heart disease of native coronary artery without angina pectoris: Secondary | ICD-10-CM

## 2011-12-26 MED ORDER — CARVEDILOL 12.5 MG PO TABS: 12.5000 mg | ORAL_TABLET | Freq: Two times a day (BID) | ORAL | Status: DC

## 2011-12-26 NOTE — Assessment & Plan Note (Signed)
His blood pressure is well controlled. I agree with carvedilol and lisinopril. He will continue to monitor his blood pressure at home. I instructed him to notify me if blood pressure is above 140/90. In that situation carvedilol can be increased and the heart rate is not too low or a small dose amlodipine can be added.

## 2011-12-26 NOTE — Assessment & Plan Note (Signed)
This was due to volume depletion and has resolved with hydration. His kidney function was back to normal before hospital discharge. I discussed with him the importance of oral hydration especially with his new job and the hot weather.

## 2011-12-26 NOTE — Progress Notes (Signed)
HPI  This is a 51 year old male who is here for a followup visit. He has chronic medical conditions that include coronary artery disease with 2 previous stents done most recently was 6 or 7 years ago in [place]. No cardiac events since then.  He also has history of type 2 diabetes and prolonged history of hypertension which according to him was diagnosed when he was 51 years old. He has strong family history of hypertension and diabetes. He also has hyperlipidemia and obesity. He was diagnosed with sleep apnea in the past but he had surgery done to correct that. He has not been tested for this recently.  The patient had significant lower extremity edema which likely was due to minoxidil which was discontinued. He was on Lasix 40 mg twice daily which was discontinued during his last visit with me due to slight volume depletion. He was hospitalized over the weekend at [facility] due to acute renal failure with a creatinine close to 4 which quickly normalized with IV fluids. 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 It requires him to be in the sun frequently with carrying heavy boxes. He was sweating profusely and was not drinking enough fluids. He was taken off losartan and spironolactone. He was also taken off isosorbide and Ranexa. He reports no chest pain. His dyspnea has improved significantly. His weight is down to 376. It was 290 during his first visit here in [place] about a year ago.  No Known Allergies   Current Outpatient Prescriptions on File Prior to Visit  Medication Sig Dispense Refill  . aspirin 81 MG tablet Take 81 mg by mouth daily.      . clopidogrel (PLAVIX) 75 MG tablet Take 75 mg by mouth daily.      Marland Kitchen lisinopril (PRINIVIL,ZESTRIL) 40 MG tablet Take 40 mg by mouth daily.      . metFORMIN (GLUCOPHAGE-XR) 750 MG 24 hr tablet Take 1 tablet (750 mg total) by mouth daily with breakfast.  30 tablet  1  . pravastatin (PRAVACHOL) 40 MG tablet Take 40 mg  by mouth daily.      Marland Kitchen DISCONTD: carvedilol (COREG) 12.5 MG tablet Take 1 tablet (12.5 mg total) by mouth 2 (two) times daily.  30 tablet  1   Current Facility-Administered Medications on File Prior to Visit  Medication Dose Route Frequency Provider Last Rate Last Dose  . DISCONTD: aspirin EC tablet 81 mg  81 mg Oral Daily Maitri S Kalia-Reynolds, DO   81 mg at 12/25/11 1320  . DISCONTD: carvedilol (COREG) tablet 12.5 mg  12.5 mg Oral BID Maitri S Kalia-Reynolds, DO   12.5 mg at 12/25/11 1021  . DISCONTD: clopidogrel (PLAVIX) tablet 75 mg  75 mg Oral Daily Maitri S Kalia-Reynolds, DO   75 mg at 12/25/11 1021  . DISCONTD: heparin injection 5,000 Units  5,000 Units Subcutaneous Q8H Maitri S Kalia-Reynolds, DO   5,000 Units at 12/25/11 1610  . DISCONTD: insulin aspart (novoLOG) injection 0-9 Units  0-9 Units Subcutaneous TID WC Maitri S Kalia-Reynolds, DO   1 Units at 12/25/11 1214  . DISCONTD: lisinopril (PRINIVIL,ZESTRIL) tablet 40 mg  40 mg Oral Daily Maitri S Kalia-Reynolds, DO   40 mg at 12/25/11 1318  . DISCONTD: metFORMIN (GLUCOPHAGE-XR) 24 hr tablet 750 mg  750 mg Oral Q breakfast Maitri S Kalia-Reynolds, DO      . DISCONTD: simvastatin (ZOCOR) tablet 20 mg  20 mg Oral q1800 Maitri S Kalia-Reynolds, DO      .  DISCONTD: sodium chloride 0.9 % injection 3 mL  3 mL Intravenous Q12H Maitri S Kalia-Reynolds, DO   3 mL at 12/25/11 1000     Past Medical History  Diagnosis Date  . Unspecified gastritis and gastroduodenitis without mention of hemorrhage   . Sebaceous cyst     multiple on face, scalp, and trunk (side, left) per Dr Juanetta Gosling 09/25/11, Referral to [facility]  . DM type 2 (diabetes mellitus, type 2) 1998    non-insulin depedent  . Coronary artery disease     2 previous stent. Most recent was in 2006  . Hypercholesterolemia   . HTN (hypertension)     since he was 51 y/o ?!!  . Sleep apnea     s/p ENT surgery more than 10 years ago.  . Cyst 2013    Removed from scalp & eye   . Acute kidney failure 2013     Past Surgical History  Procedure Date  . Coronary stent placement 2002, 2006    coronary artery stent  . Cardiac catheterization   . Acne cyst removal 2013    x3     Family History  Problem Relation Age of Onset  . Heart disease Father   . Heart disease Brother      History   Social History  . Marital Status: Married    Spouse Name: N/A    Number of Children: 1  . Years of Education: 12th grade   Occupational History  . paper delivery    Social History Main Topics  . Smoking status: Never Smoker   . Smokeless tobacco: Current User    Types: Chew  . Alcohol Use: Yes     once or twice weekly  . Drug Use: No  . Sexually Active: Not on file   Other Topics Concern  . Not on file   Social History Narrative   Lives at home with his wife.      PHYSICAL EXAM   BP 138/68  Pulse 66  Ht 6\' 2"  (1.88 m)  Wt 276 lb (125.193 kg)  BMI 35.44 kg/m2  Constitutional: He is oriented to person, place, and time. He appears well-developed and well-nourished. No distress.  HENT: No nasal discharge.  Head: Normocephalic and atraumatic.  Eyes: Pupils are equal and round. Right eye exhibits no discharge. Left eye exhibits no discharge.  Neck: Normal range of motion. Neck supple. No JVD present. No thyromegaly present.  Cardiovascular: Normal rate, regular rhythm, normal heart sounds and. Exam reveals no gallop and no friction rub. No murmur heard.  Pulmonary/Chest: Effort normal and breath sounds normal. No stridor. No respiratory distress. He has no wheezes. He has no rales. He exhibits no tenderness.  Abdominal: Soft. Bowel sounds are normal. He exhibits no distension. There is no tenderness. There is no rebound and no guarding.  Musculoskeletal: Normal range of motion. He exhibits no edema and no tenderness.  Neurological: He is alert and oriented to person, place, and time. Coordination normal.  Skin: Skin is warm and dry. No rash noted.  He is not diaphoretic. No erythema. No pallor.  Psychiatric: He has a normal mood and affect. His behavior is normal. Judgment and thought content normal.        ASSESSMENT AND PLAN

## 2011-12-26 NOTE — Assessment & Plan Note (Signed)
He reports no symptoms suggestive of angina. Continue medical therapy. 

## 2011-12-26 NOTE — Patient Instructions (Addendum)
Continue same medications.  Follow up in 3 months.  

## 2012-01-01 ENCOUNTER — Telehealth: Payer: Self-pay | Admitting: *Deleted

## 2012-01-01 NOTE — Telephone Encounter (Signed)
LMTCB due to refill request for Spironolactone that is not on medication list and was discontinued when in [facility].

## 2012-01-02 ENCOUNTER — Other Ambulatory Visit: Payer: Self-pay

## 2012-01-02 MED ORDER — CARVEDILOL 12.5 MG PO TABS: 12.5000 mg | ORAL_TABLET | Freq: Two times a day (BID) | ORAL | Status: DC

## 2012-01-02 NOTE — Telephone Encounter (Signed)
Refill sent for carvedilol 12.5 mg take one tablet daily.

## 2012-01-03 ENCOUNTER — Other Ambulatory Visit: Payer: Self-pay

## 2012-01-03 ENCOUNTER — Telehealth: Payer: Self-pay | Admitting: Cardiovascular Disease

## 2012-01-03 DIAGNOSIS — R079 Chest pain, unspecified: Secondary | ICD-10-CM

## 2012-01-03 MED ORDER — CARVEDILOL 25 MG PO TABS: 25.0000 mg | ORAL_TABLET | Freq: Two times a day (BID) | ORAL | Status: DC

## 2012-01-03 MED ORDER — ISOSORBIDE MONONITRATE ER 30 MG PO TB24: 30.0000 mg | ORAL_TABLET | Freq: Every day | ORAL | Status: AC

## 2012-01-03 NOTE — Telephone Encounter (Signed)
Pt's wife says pt's BP has been gradually getting higher since last OV. She says it stayed consistently at 110/60's but has now increased to >150/80 for the past few days. Dr. Kirke Corin mentioned in his last note to increase carvedilol if BP becomes elevated again. Hr=70's I advised wife to go ahead and increase carvedilol to 25 mg BID, monitor BP and HR, and call if HR<60  BPM on new dose coreg  She also mentions Imdur being stopped in hospital recently. She says he had an episode of CP last night that required a NTG. She wonders if Imdur should be restarted. She says he is at work now and unsure if he is having active CP at the moment. She says he has "anxiety attacks" that can mimic cardiac CP as well. I told her I would discuss with Dr. Kirke Corin to see if he wants Imdur restarted and will lcall her back. Understanding verb.

## 2012-01-03 NOTE — Telephone Encounter (Signed)
Increase Coreg to 25 mg bid. Resume Imdur 30 mg once daily. Schedule him for a treadmill nuclear stress test (I placed an order).

## 2012-01-03 NOTE — Telephone Encounter (Signed)
Patient's wife called and stated that since Samuel Navarro has been off the two old medications, his blood pressure has been gradually going up last night and today running 160/85.  Also the chest pain has come back this week and was really bad last night.  Would like to know if he can start any medications back to help with this, please call wife back at [phone]

## 2012-01-03 NOTE — Telephone Encounter (Signed)
LMTCB

## 2012-01-03 NOTE — Telephone Encounter (Signed)
Pt's wife informed. Understanding verb. i will call to arrange stress test and will call her back with details

## 2012-01-03 NOTE — Telephone Encounter (Signed)
Verbal instructions given to pt's wife. Understanding verb.l

## 2012-01-03 NOTE — Telephone Encounter (Signed)
Please see below and advise. Thanks

## 2012-01-04 DIAGNOSIS — I255 Ischemic cardiomyopathy: Secondary | ICD-10-CM

## 2012-01-04 HISTORY — DX: Ischemic cardiomyopathy: I25.5

## 2012-01-08 ENCOUNTER — Telehealth: Payer: Self-pay

## 2012-01-08 ENCOUNTER — Other Ambulatory Visit: Payer: Self-pay | Admitting: Cardiovascular Disease

## 2012-01-08 ENCOUNTER — Ambulatory Visit: Payer: Self-pay | Admitting: Cardiovascular Disease

## 2012-01-08 DIAGNOSIS — R079 Chest pain, unspecified: Secondary | ICD-10-CM

## 2012-01-08 NOTE — Telephone Encounter (Signed)
Per Dr. Kirke Corin, Lexi performed this am was positive. Pt needs cath scheduled for tomm. I will call to notify pt  I spoke with wife who will call pt to make sure tomm is ok date. She will call me right back.

## 2012-01-08 NOTE — Telephone Encounter (Signed)
Verbal instructions given to pt's wife. Understanding verb "Patient does not need pre cath chest x ray". V.O Dr. Adline Mango, RN

## 2012-01-08 NOTE — Telephone Encounter (Signed)
Wife called back. Says ok to proceed with [facility] tomm Scheduled for 01/09/12 at 1:30 pm

## 2012-01-09 ENCOUNTER — Ambulatory Visit: Payer: Self-pay | Admitting: Cardiovascular Disease

## 2012-01-09 DIAGNOSIS — I2510 Atherosclerotic heart disease of native coronary artery without angina pectoris: Secondary | ICD-10-CM

## 2012-01-09 LAB — PROTIME-INR: INR: 1

## 2012-01-10 ENCOUNTER — Other Ambulatory Visit: Payer: Self-pay

## 2012-01-10 ENCOUNTER — Encounter: Payer: Self-pay | Admitting: Cardiovascular Disease

## 2012-01-10 ENCOUNTER — Telehealth: Payer: Self-pay | Admitting: Physician Assistant

## 2012-01-10 ENCOUNTER — Telehealth: Payer: Self-pay

## 2012-01-10 LAB — BASIC METABOLIC PANEL
Anion Gap: 8 (ref 7–16)
BUN: 11 mg/dL (ref 7–18)
Chloride: 107 mmol/L (ref 98–107)
Creatinine: 0.97 mg/dL (ref 0.60–1.30)
EGFR (Non-African Amer.): >60
Glucose: 114 mg/dL — ABNORMAL HIGH (ref 65–99)
Potassium: 3.3 mmol/L — ABNORMAL LOW (ref 3.5–5.1)
Sodium: 142 mmol/L (ref 136–145)

## 2012-01-10 MED ORDER — AMLODIPINE BESYLATE 10 MG PO TABS: 5.0000 mg | ORAL_TABLET | Freq: Every day | ORAL | Status: DC

## 2012-01-10 NOTE — Telephone Encounter (Signed)
Pt's wife called to say pt was d/c home this am s/p stent to RCA. Says he is c/o CP and BP is elevated (177/91). Prior to hosp d/c his BP was 197/102. He was given his am meds and immediately before d/c BP had decreased to 140/90. HE was then released. Since being at home, BP is gradually rising again. Wife is concerned and wants to know what to do. Pt denies sob or any other associated symptoms. I had wife hold while I paged Dr. Mariah Milling.

## 2012-01-10 NOTE — Telephone Encounter (Signed)
Patient's wife called re: patient's elevated BP at 197/102. He was recently discharged from the hospital after having a stent placed. He was given his usual antihypertensives, and had continued his discharge medication regimen after his hospitalization. He called Dr. Jari Sportsman office and spoke to [place], [place] regarding elevated BP with chest pain soon after discharge. This was ran by Dr. Mariah Milling who advised to add Norvasc 5mg  daily, and if BP does not improve, increase to Norvasc 10mg  daily. Explained this to patient's wife who understood. Advised to take additional 5mg  Norvasc and monitor BP over the next hour as well as the patient's symptoms. If neither are improving and/or worsening, advised to take to the nearest ED. If BP remains elevated (SBP 150-190 range), advised to take 1/2 tablet of home Lisinopril (20mg ) and continue to monitor BP. She understood and agreed. Will call for further recommendations if hypertension persists.    Jacqulyn Bath, PA-C 01/10/2012 8:16 PM

## 2012-01-10 NOTE — Telephone Encounter (Signed)
I was able to speak with Dr. Mariah Milling about pt. He advised to have pt try Norvasc 5 mg po QD for BP. If this does not help, he can try 10 mg PO qd. He thinks CP is secondary to elevated BP.   I explained this to wife who says they have amlodipine at home from where pt took this in the past, up until 2 weeks ago.  No RX needed to be sent in. Wife will call us in am/overnight/on call MD if BP does not improve.

## 2012-01-15 ENCOUNTER — Encounter: Payer: Self-pay | Admitting: Cardiovascular Disease

## 2012-01-15 ENCOUNTER — Telehealth: Payer: Self-pay

## 2012-01-15 ENCOUNTER — Ambulatory Visit (INDEPENDENT_AMBULATORY_CARE_PROVIDER_SITE_OTHER): Payer: 59 | Admitting: Cardiovascular Disease

## 2012-01-15 VITALS — BP 120/84 | HR 58 | Ht 74.0 in | Wt 269.5 lb

## 2012-01-15 DIAGNOSIS — I10 Essential (primary) hypertension: Secondary | ICD-10-CM

## 2012-01-15 DIAGNOSIS — E7800 Pure hypercholesterolemia, unspecified: Secondary | ICD-10-CM

## 2012-01-15 DIAGNOSIS — E785 Hyperlipidemia, unspecified: Secondary | ICD-10-CM

## 2012-01-15 DIAGNOSIS — I2510 Atherosclerotic heart disease of native coronary artery without angina pectoris: Secondary | ICD-10-CM

## 2012-01-15 MED ORDER — HYDROCHLOROTHIAZIDE 25 MG PO TABS: 25.0000 mg | ORAL_TABLET | Freq: Every day | ORAL | Status: DC

## 2012-01-15 MED ORDER — AMLODIPINE BESYLATE 5 MG PO TABS: 5.0000 mg | ORAL_TABLET | Freq: Every day | ORAL | Status: DC

## 2012-01-15 NOTE — Telephone Encounter (Signed)
Pt was called to find out how he is doing post hospital.  Mrs Guiffre states he is doing ok (tired but ok) now but his bp has been erratic (up and down) but he has been writing it down and will bring the readings to his appt this morning.  She states they had a lot of trouble with his bp being elevated on Wed and he increased his Norvasc to 10 mg daily per Dr Windell Hummingbird instructions.  He is taking the medications as prescribed on his discharge instructions.

## 2012-01-15 NOTE — Assessment & Plan Note (Signed)
He is doing well at this time after her recent angioplasty and drug-eluting stent placement to the distal RCA. 2 Previously placed stents were patent without significant restenosis. He does have diffuse branch disease as well as an occluded AV groove artery with collaterals. I recommend continuing aggressive medical therapy. He is to stay on long-term dual antiplatelet therapy. I advised him to attend cardiac rehabilitation. 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

## 2012-01-15 NOTE — Assessment & Plan Note (Signed)
The patient has diffuse CAD and needs aggressive treatment of hyperlipidemia with a target LDL of less than 70. Most likely he will need a more potent statin the pravastatin. I will check fasting lipid and liver profile and decide.

## 2012-01-15 NOTE — Progress Notes (Signed)
HPI  This is a 51 year old man who is here today for a followup visit. He is known to have coronary artery disease with 2 previous stents in 2006, refractory hypertension without evidence of renal artery stenosis and recent admission for acute renal failure due to volume depletion. Some of his medications were stopped during that admission. He was seen after that and was feeling reasonably well. He called our office complaining of increased dyspnea chest pain. He underwent a nuclear stress test. He was not able to get his heart rate up and thus it was switched to a pharmacologic nuclear stress test. He had significant ST depression with Lexiscan as well as transient ischemic dilatation on imaging without significant reversible defects. He underwent cardiac catheterization which showed : Left main 20% stenosis, LAD 40% stenosis, 70% ostial disease in a small diagonal and OM 2, patent stent in OM 3 and mid RCA without significant restenosis, 85% stenosis in the distal RCA which was treated with a 2.5 x 12 mm DES, occluded posterior AV groove with good collaterals(chronic), EF 45%. He was noted to be hypertensive before hospital discharge. Thus, hydrochlorothiazide 25 mg daily was added to his medications. He called after hospital discharge with increased blood pressure and thus amlodipine 10 mg once daily was added. Since then, he is feeling much better. He did notice a few low blood pressure readings with systolic blood pressure of 100 on 2 occasions. He was dizzy at that time. He denies chest pain or dyspnea.  No Known Allergies   Current Outpatient Prescriptions on File Prior to Visit  Medication Sig Dispense Refill  . aspirin 81 MG tablet Take 81 mg by mouth daily.      . carvedilol (COREG) 25 MG tablet Take 1 tablet (25 mg total) by mouth 2 (two) times daily.  180 tablet  3  . clopidogrel (PLAVIX) 75 MG tablet Take 75 mg by mouth daily.      . hydrochlorothiazide (HYDRODIURIL) 25 MG tablet Take  1 tablet (25 mg total) by mouth daily.  90 tablet  3  . isosorbide mononitrate (IMDUR) 30 MG 24 hr tablet Take 1 tablet (30 mg total) by mouth daily.  90 tablet  3  . lisinopril (PRINIVIL,ZESTRIL) 40 MG tablet Take 40 mg by mouth daily.      . metFORMIN (GLUCOPHAGE-XR) 750 MG 24 hr tablet Take 1 tablet (750 mg total) by mouth daily with breakfast.  30 tablet  1  . pravastatin (PRAVACHOL) 40 MG tablet Take 40 mg by mouth daily.         Past Medical History  Diagnosis Date  . Unspecified gastritis and gastroduodenitis without mention of hemorrhage   . Sebaceous cyst     multiple on face, scalp, and trunk (side, left) per Dr Juanetta Gosling 09/25/11, Referral to [facility]  . DM type 2 (diabetes mellitus, type 2) 1998    non-insulin depedent  . Hypercholesterolemia   . HTN (hypertension)     since he was 51 y/o ?!!  . Sleep apnea     s/p ENT surgery more than 10 years ago.  . Cyst 2013    Removed from scalp & eye  . Acute kidney failure 2013  . Kidney failure   . Coronary artery disease     2 previous stents in [place] in 2006. Cardiac cath in August 2013 at [facility] showed: Left main 20% stenosis, LAD 40% stenosis, 70% ostial disease in the small diagonal and OM 2, patent  stent in OM 3 and mid RCA without significant restenosis, 85% stenosis in the distal RCA which was treated with a 2.5 x 12 mm DES, occluded posterior AV groove with good collaterals(chronic), EF 45%.     Past Surgical History  Procedure Date  . Coronary stent placement 2002, 2006    coronary artery stent  . Acne cyst removal 2013    x3  . Cardiac catheterization 2013    @ [facility]     Family History  Problem Relation Age of Onset  . Heart disease Father   . Heart disease Brother      History   Social History  . Marital Status: Married    Spouse Name: N/A    Number of Children: 1  . Years of Education: 12th grade   Occupational History  . paper delivery    Social History Main Topics  . Smoking  status: Never Smoker   . Smokeless tobacco: Current User    Types: Chew  . Alcohol Use: Yes     once or twice weekly  . Drug Use: No  . Sexually Active: Not on file   Other Topics Concern  . Not on file   Social History Narrative   Lives at home with his wife.     PHYSICAL EXAM   BP 120/84  Pulse 58  Ht 6\' 2"  (1.88 m)  Wt 269 lb 8 oz (122.244 kg)  BMI 34.60 kg/m2 Constitutional: He is oriented to person, place, and time. He appears well-developed and well-nourished. No distress.  HENT: No nasal discharge.  Head: Normocephalic and atraumatic.  Eyes: Pupils are equal and round. Right eye exhibits no discharge. Left eye exhibits no discharge.  Neck: Normal range of motion. Neck supple. No JVD present. No thyromegaly present.  Cardiovascular: Normal rate, regular rhythm, normal heart sounds and. Exam reveals no gallop and no friction rub. No murmur heard.  Pulmonary/Chest: Effort normal and breath sounds normal. No stridor. No respiratory distress. He has no wheezes. He has no rales. He exhibits no tenderness.  Abdominal: Soft. Bowel sounds are normal. He exhibits no distension. There is no tenderness. There is no rebound and no guarding.  Musculoskeletal: Normal range of motion. He exhibits no edema and no tenderness.  Neurological: He is alert and oriented to person, place, and time. Coordination normal.  Skin: Skin is warm and dry. No rash noted. He is not diaphoretic. No erythema. No pallor.  Psychiatric: He has a normal mood and affect. His behavior is normal. Judgment and thought content normal.  Right radial pulse is normal with no hematoma.      ASSESSMENT AND PLAN

## 2012-01-15 NOTE — Patient Instructions (Addendum)
Labs today.  Decrease Amlodipine to 5 mg once daily.  You can go back to work tomorrow.  Follow up in 3 months.

## 2012-01-15 NOTE — Assessment & Plan Note (Signed)
His blood pressure is now well controlled but he had few low blood pressure readings after recent additions of amlodipine and hydrochlorothiazide. Due to his previous lower extremity edema, I will decrease the dose of amlodipine 5 mg once daily. I will also check basic metabolic profile today given his previous renal failure with diuretics.

## 2012-01-16 ENCOUNTER — Other Ambulatory Visit: Payer: Self-pay

## 2012-01-16 LAB — HEPATIC FUNCTION PANEL
AST: 11 IU/L (ref 0–40)
Albumin: 4.2 g/dL (ref 3.5–5.5)
Alkaline Phosphatase: 75 IU/L (ref 44–102)
Bilirubin, Direct: 0.19 mg/dL (ref 0.00–0.40)

## 2012-01-16 LAB — LIPID PANEL
Chol/HDL Ratio: 4.4 ratio units (ref 0.0–5.0)
Triglycerides: 127 mg/dL (ref 0–149)

## 2012-01-16 LAB — BASIC METABOLIC PANEL
BUN: 16 mg/dL (ref 6–24)
Calcium: 9.2 mg/dL (ref 8.7–10.2)
Creatinine, Ser: 0.84 mg/dL (ref 0.76–1.27)
GFR calc non Af Amer: 102 mL/min/{1.73_m2} (ref >59)
Sodium: 138 mmol/L (ref 134–144)

## 2012-01-16 MED ORDER — ATORVASTATIN CALCIUM 40 MG PO TABS: 40.0000 mg | ORAL_TABLET | Freq: Every day | ORAL | Status: DC

## 2012-01-16 NOTE — Discharge Summary (Signed)
Patient Name:  Samuel Navarro MRN: [id number]  PCP: Park Pope, MD DOB:  07/25/1960       Date of Admission:  12/23/2011  Date of Discharge:  12/25/2011     Attending Physician: Dr. Blanch Media      DISCHARGE DIAGNOSES: Acute kidney injury Hypertension Diabetes mellitus, type 2 CAD (s/p stents 2002, 2006) HLD  DISPOSITION AND FOLLOW-UP: 1. Assess patient's BP response to changes in anti-hypertensive regimen.  2. TSH slightly decreased during hospital course (0.274). TSH should be reassessed in outpatient setting when patient is not acutely ill.  3. Assess patient's response to d/c of glipizide. 4. Assess for presence of microalbuminuria given hx DMII and poorly-controlled HTN. 4. Labs needed: BMET, CBG, TSH, urine microalbumin, urine Cr  Follow-up Information    Follow up with Lorine Bears, MD. (CARDIOLOGY - Your appointment is on 12/26/2011 at 3:00PM.)    Contact information:   [address] [place] [address] [phone]       Follow up with Park Pope, MD. (You have an appointment scheduled for 01/08/2012 at 2:30PM.)    Contact information:   [address] [place] [address] [phone]         Discharge Orders    Future Appointments: Provider: Department: Dept Phone: Center:   03/28/2012 3:00 PM Iran Ouch, MD [facility] [phone] [email or web address]     Future Orders Please Complete By Expires   Diet - low sodium heart healthy      Increase activity slowly      Call MD for:  persistant dizziness or light-headedness      Call MD for:  extreme fatigue      (HEART FAILURE PATIENTS) Call MD:  Anytime you have any of the following symptoms: 1) 3 pound weight gain in 24 hours or 5 pounds in 1 week 2) shortness of breath, with or without a dry hacking cough 3) swelling in the hands, feet or stomach 4) if you have to sleep on extra pillows at night in order to breathe.        DISCHARGE  MEDICATIONS: Medication List  As of 01/16/2012 11:21 PM   STOP taking these medications         amLODipine 10 MG tablet      glipiZIDE 2.5 MG 24 hr tablet      isosorbide mononitrate 30 MG 24 hr tablet      losartan 100 MG tablet      potassium chloride SA 20 MEQ tablet      ranolazine 500 MG 12 hr tablet      spironolactone 25 MG tablet         TAKE these medications         aspirin 81 MG tablet   Take 81 mg by mouth daily.      clopidogrel 75 MG tablet   Commonly known as: PLAVIX   Take 75 mg by mouth daily.      lisinopril 40 MG tablet   Commonly known as: PRINIVIL,ZESTRIL   Take 40 mg by mouth daily.      metFORMIN 750 MG 24 hr tablet   Commonly known as: GLUCOPHAGE-XR   Take 1 tablet (750 mg total) by mouth daily with breakfast.      pravastatin 40 MG tablet   Commonly known as: PRAVACHOL   Take 40 mg by mouth daily.           ADMISSION DATA:  H&P: Patient is a 51 y.o. male with a PMHx of CAD s/p stenting (6-7 years ago), DMII well controlled with last A1c 6.2, HTN (which apparently was diagnosed at age 52), and recent intentional weight loss of 50 lbs (since January) who presents to [facility] for evaluation of 2 week history of progressively worsening generalized weakness, positional lightheadedness, specifically worsened over the last several days prior to admission. 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 This job his requiring the patient to be outside in the sun, sweating more frequently (he even is having to change his clothes during the shifts due to extent of the perspiration). Due to the hectic nature of his job, the patient is not eating or drinking regularly. He has been experiencing positional lightheadedness going from seated to standing positions as is required for his job. The patient is on a  significant regimen of cardiac medications as an outpatient, including an ACE inhibitor, ARB, spironolactone, Ranexa, and Imdur. He has continued all of the above medications over the last 2 weeks despite his symptomatology. He was also previously on Lasix, which was discontinued on 10/03/2011 by his primary cardiologist in the setting of orthostatic hypotension. On 11/13/2011, the patient's cardiologist noted him to have mildly elevated potassium level of 5.2, secondary to which the patient was instructed to decrease his losartan from 100 mg daily to 50 mg daily. He was instructed to return for repeat labs, however did not do so until the day prior to admission.  On 12/22/2011, the patient had repeat BMET performed at his PCPs office, apparently, this BMET revealed a potassium of 7.2 and BUN of 84 (from prior K. of 5.2, creatinine of 1.2 on 11/20/2011) secondary to which he was instructed to report to the ER for further evaluation. On evaluation of the [facility] ER, the patient's potassium level is 5, and creatinine is elevated at 4.87.  The patient continues to make urine, although it is darker in the less volume. He otherwise denies chest pain, palpitations, nausea, vomiting, diarrhea, abdominal pain, fevers, chills.  Physical Exam: Vital Signs:  Blood pressure 118/72, pulse 58, temperature 98.3 F (36.8 C), temperature source Oral, resp. rate 20, height 6\' 2"  (1.88 m), weight 264 lb (119.75 kg), SpO2 99.00%.  Physical Exam:  General:  Vital signs reviewed and noted. Well-developed, well-nourished, in no acute distress; alert, appropriate and cooperative throughout examination.   Head:  Normocephalic, atraumatic.   Eyes:  PERRL, EOMI, No signs of anemia or jaundince.   Nose:  Mucous membranes moist, not inflammed, nonerythematous.   Throat:  Oropharynx nonerythematous, no exudate appreciated.   Neck:  No deformities, masses, or tenderness noted. Supple, no JVD.   Lungs:  Normal respiratory effort. Clear  to auscultation BL without crackles or wheezes.   Heart:  RRR. S1 and S2 normal without gallop, murmur, or rubs.   Abdomen:  BS normoactive. Soft, Nondistended, non-tender. No masses or organomegaly.   Extremities:  No pretibial edema.   Neurologic:  A&O X3, CN II - XII are grossly intact. Motor strength is 5/5 in the all 4 extremities, Sensations intact to light touch, Cerebellar signs negative.   Skin:  No visible rashes, scars.    Labs: Basic Metabolic Panel:   Basename  12/23/11 1150   NA  130*  K  5.0   CL  97   CO2  17*   GLUCOSE  155*   BUN  85*   CREATININE  4.87*   CALCIUM  9.6   MG  --   PHOS  --    Liver Function Tests:   Basename  12/23/11 1150   AST  19   ALT  12   ALKPHOS  66   BILITOT  0.6   PROT  8.6*   ALBUMIN  4.6    CBC:   Basename  12/23/11 1150   WBC  5.9   NEUTROABS  --   HGB  14.0   HCT  38.7*   MCV  83.9   PLT  288    Urinalysis:   Ref. Range  12/23/2011 14:59   Color, Urine  Latest Range: YELLOW  YELLOW   APPearance  Latest Range: CLEAR  CLOUDY (A)   Specific Gravity, Urine  Latest Range: 1.005-1.030  1.020   pH  Latest Range: 5.0-8.0  5.0   Glucose  Latest Range: NEGATIVE mg/dL  NEGATIVE   Bilirubin Urine  Latest Range: NEGATIVE  NEGATIVE   Ketones, ur  Latest Range: NEGATIVE mg/dL  NEGATIVE   Protein  Latest Range: NEGATIVE mg/dL  30 (A)   Urobilinogen, UA  Latest Range: 0.0-1.0 mg/dL  0.2   Nitrite  Latest Range: NEGATIVE  NEGATIVE   Leukocytes, UA  Latest Range: NEGATIVE  NEGATIVE   Hgb urine dipstick  Latest Range: NEGATIVE  NEGATIVE   WBC, UA  Latest Range: <3 WBC/hpf  0-2   Squamous Epithelial / LPF  Latest Range: RARE  RARE   Casts  Latest Range: NEGATIVE  HYALINE CASTS (A)   Miscellaneous labs:   Ref. Range  12/23/2011 14:59   Creatinine, Urine  No range found  257.86    HOSPITAL COURSE: Acute kidney injury - Cr on admission = 4.9, with baseline ~ 1.2. This issue was determined to be secondary to prerenal azotemia in the  setting of significant insensible losses and ongoing diuretic usage. No renal or post-renal component was identified as a factor in the patient's AKI. The patient was rehydrated with IV normal saline, and his creatinine decreased from 4.9 to 0.84 over the ensuing 48 hours. Patient was counseled extensively on proper hydration and avoiding insensible losses as best as possible given his new position involving significant physical labor and sun exposure. Mr. Geiman would benefit from outpatient evaluation of microalbumin/Cr ratio to assess for presence of proteinuria in setting of long-standing DM and poorly-controlled HTN.  AG metabolic acidosis - Patient had an anion gap of 16 on admission, which closed over the ensuing 24 hours and did not recur during his hospital course. This AG metabolic acidosis was likely caused by uremia (2/2 to prerenal azotemia) and also mild lactic acidosis (lactate = 2.4, likely 2/2 increased metabolism from new occupation).   Hypertension - Patient had orthostatic hypotension early in hospital course which resolved after repleting his volume deficit. Orthostatic hypotension likely secondary to imdur, amlodipine (and other anti-HTN) usage in setting of significant hypovolemia.  As patient was on a very extensive anti-hypertensive regimen on admission which was difficult for him to afford and was causing him untoward effects (e.g. Orthostatic hypotension, hyperkalemia) his regimen was simplified upon discharge. Imdur was discontinued as it had contributed to orthostatic hypotension and because the patient felt he didn't need it as much since he was able to lose >50 pounds since the beginning of the  year and his BP improved during that time. Aldactone was discontinued due to hyperkalemia prior to admission (potassium supplementation was d/c'd for the same reason). Losartan was also d/c'd due to concerns for contributing for hyperkalemia, possibility of exacerbating any future prerenal  azotemia, and because the patient was also on an ACE-inhibitor (which he was discharged with instructions to continue). Patient was instructed to follow-up on medication changes with his cardiologist, who he was to see the day following his discharge. (To note: this d/c summary was written after the patient's follow up visit with cardiology on 12/26/2011. The patient was discharged on carvedilol 12.5mg  bid, however it appears that this med was temporarily discontinued following his 12/26/2011 visit.)  Diabetes mellitus, type 2 - Hgb A1c = 6.8. Patient was on both glipizide and metformin PTA, but patient has had better glycemic control since aforementioned weight loss. For this reason, and in an effort to further simplify his regimen to increase compliance and decrease cost, glipizide was d/c'd and metformin was continued at time of discharge.   CAD (s/p stents 2002, 2006) - Stable during hospital course. Patient denies any angina as outpatient, and did not think ranolazine was benefiting him. Ranolazine was therefore d/c'd on discharge, in an effort to decrease out-of-pocket costs to patient.  HLD - LDL = 92. Stable on home statin.  DISCHARGE DATA: Vital Signs: BP 146/79  Pulse 69  Temp 98.2 F (36.8 C) (Oral)  Resp 18  Ht 6\' 2"  (1.88 m)  Wt 269 lb 6.4 oz (122.2 kg)  BMI 34.59 kg/m2  SpO2 98%  Signed: Elfredia Nevins, MD   PGY I, Internal Medicine Resident 01/16/2012, 11:21 PM

## 2012-01-18 ENCOUNTER — Encounter: Payer: 59 | Admitting: Cardiovascular Disease

## 2012-02-07 ENCOUNTER — Telehealth: Payer: Self-pay

## 2012-02-07 NOTE — Telephone Encounter (Signed)
Decrease Aspirin to 81 mg once daily.

## 2012-02-07 NOTE — Telephone Encounter (Signed)
Wife called to say that ever since ASA was increased from 81 mg to 325 mg s/p stent pt has had multiple nosebleeds (usually Q am). She asks if ok to decrease ASA back to 81 mg qd. She confirms he is taking plavix as prescribed as well. I told her I believe this would be ok but that I would check with Dr. Kirke Corin and call her back.  Understanding verb. Pt took the 325 mg tablet this am.

## 2012-02-07 NOTE — Telephone Encounter (Signed)
Pt's wife informed Understanding verb 

## 2012-02-19 ENCOUNTER — Other Ambulatory Visit: Payer: Self-pay | Admitting: Cardiovascular Disease

## 2012-02-19 MED ORDER — CARVEDILOL 25 MG PO TABS: 25.0000 mg | ORAL_TABLET | Freq: Two times a day (BID) | ORAL | Status: DC

## 2012-02-19 NOTE — Telephone Encounter (Signed)
Refilled Carvedilol. 

## 2012-02-19 NOTE — Telephone Encounter (Signed)
Needs 90 day sent in

## 2012-02-20 ENCOUNTER — Other Ambulatory Visit: Payer: Self-pay | Admitting: Cardiovascular Disease

## 2012-02-20 MED ORDER — CARVEDILOL 25 MG PO TABS: 25.0000 mg | ORAL_TABLET | Freq: Two times a day (BID) | ORAL | Status: DC

## 2012-02-20 NOTE — Telephone Encounter (Signed)
Refill sent for 30 day supply to [facility] for coreg.

## 2012-02-20 NOTE — Telephone Encounter (Signed)
Pt needs 30 day due to [facility] not getting meds out in time.

## 2012-03-04 ENCOUNTER — Telehealth: Payer: Self-pay | Admitting: Cardiovascular Disease

## 2012-03-04 NOTE — Telephone Encounter (Signed)
Ok. That's fine.

## 2012-03-04 NOTE — Telephone Encounter (Signed)
Patient's wife called to let Dr. Kirke Corin know that Mr. Marullo stopped taking the Lipitor due to muscle aches and started back on the other medication prior to lipitor.

## 2012-03-04 NOTE — Telephone Encounter (Signed)
FYI

## 2012-03-12 ENCOUNTER — Encounter: Payer: Self-pay | Admitting: Cardiovascular Disease

## 2012-03-12 ENCOUNTER — Ambulatory Visit (INDEPENDENT_AMBULATORY_CARE_PROVIDER_SITE_OTHER): Payer: 59 | Admitting: Cardiovascular Disease

## 2012-03-12 VITALS — BP 140/90 | HR 63 | Ht 74.0 in | Wt 260.2 lb

## 2012-03-12 DIAGNOSIS — E7800 Pure hypercholesterolemia, unspecified: Secondary | ICD-10-CM

## 2012-03-12 DIAGNOSIS — I10 Essential (primary) hypertension: Secondary | ICD-10-CM

## 2012-03-12 DIAGNOSIS — I2510 Atherosclerotic heart disease of native coronary artery without angina pectoris: Secondary | ICD-10-CM

## 2012-03-12 NOTE — Telephone Encounter (Signed)
Pt wife calling states that pt is having extreme back pain.

## 2012-03-12 NOTE — Assessment & Plan Note (Signed)
Blood pressure is well controlled on today's visit. No changes made to the medications. 

## 2012-03-12 NOTE — Telephone Encounter (Signed)
Pt's wife says pt has been having "excrutiating" back pain.  He thought this was attributed to statin, but he has been holding this for a few weeks. His back pain is getting worse. Says pain is underneath both shoulder blades and he called wife this am "almost in tears". She says he continues to take plavix as prescribed.  He went on to work but is also very weak, fatigue and has worsening dyspnea. Because of his recent hx of stent restenosis, I advised wife to have pt come in to be assessed.  Wife agrees and verb. Understanding.  I explained Dr. Kirke Corin out this week and wife requested Dr. Mariah Milling.  Worked pt in with Dr. Mariah Milling today at 3:15 pm.

## 2012-03-12 NOTE — Patient Instructions (Addendum)
Stop the atorvastatin, take no cholesterol pill for one week Try co enz Q10  Restart low dose pravastatin 1/4 pill once a day Increase dose slowly once a week  Please call us if you have new issues that need to be addressed before your next appt.  Your physician wants you to follow-up in: 2 months with [place]

## 2012-03-12 NOTE — Assessment & Plan Note (Signed)
Currently with no symptoms of angina. No further workup at this time. Continue current medication regimen. 

## 2012-03-12 NOTE — Progress Notes (Signed)
Patient ID: Samuel Navarro, male    DOB: Sep 21, 1960, 51 y.o.   MRN: [id number]  HPI Comments: 51 year old man with a history of coronary artery disease, stenting x2 in 2006, hypertension, acute renal failure due to volume depletion, recent cardiac catheterization which showed : Left main 20% stenosis, LAD 40% stenosis, 70% ostial disease in a small diagonal and OM 2, patent stent in OM 3 and mid RCA without significant restenosis, 85% stenosis in the distal RCA which was treated with a 2.5 x 12 mm DES, occluded posterior AV groove with good collaterals(chronic), EF 45%.  He presents today for add-on visit with right back pain, radiating to his right flank and right chest. He was previously on pravastatin 40 mg daily. This was changed several weeks ago to Lipitor 40 mg daily. He started having severe back pain on Lipitor, stop the medication and changed back to pravastatin once his symptoms resolved. Symptoms have recurred on pravastatin. Last dose was yesterday. His wife reports that symptoms are severe and he is taking pain pills. He was previously on pravastatin for several years. LDL on pravastatin was 92.         Outpatient Encounter Prescriptions as of 03/12/2012  Medication Sig Dispense Refill  . amLODipine (NORVASC) 5 MG tablet Take 1 tablet (5 mg total) by mouth daily.  180 tablet  3  . aspirin 81 MG tablet Take 81 mg by mouth daily.      . carvedilol (COREG) 25 MG tablet Take 1 tablet (25 mg total) by mouth 2 (two) times daily.  30 tablet  6  . clopidogrel (PLAVIX) 75 MG tablet Take 75 mg by mouth daily.      . hydrochlorothiazide (HYDRODIURIL) 25 MG tablet Take 1 tablet (25 mg total) by mouth daily.  90 tablet  3  . isosorbide mononitrate (IMDUR) 30 MG 24 hr tablet Take 1 tablet (30 mg total) by mouth daily.  90 tablet  3  . lisinopril (PRINIVIL,ZESTRIL) 40 MG tablet Take 40 mg by mouth daily.      . metFORMIN (GLUCOPHAGE-XR) 750 MG 24 hr tablet Take 1 tablet (750 mg total) by mouth  daily with breakfast.  30 tablet  1  . pravastatin (PRAVACHOL) 40 MG tablet Take 40 mg by mouth daily.         Review of Systems  Constitutional: Negative.   HENT: Negative.   Eyes: Negative.   Respiratory: Negative.   Cardiovascular: Negative.   Gastrointestinal: Negative.   Musculoskeletal: Positive for myalgias and back pain.  Skin: Negative.   Neurological: Negative.   Hematological: Negative.   Psychiatric/Behavioral: Negative.   All other systems reviewed and are negative.    BP 140/90  Pulse 63  Ht 6\' 2"  (1.88 m)  Wt 260 lb 4 oz (118.049 kg)  BMI 33.41 kg/m2  Physical Exam  Nursing note and vitals reviewed. Constitutional: He is oriented to person, place, and time. He appears well-developed and well-nourished.  HENT:  Head: Normocephalic.  Nose: Nose normal.  Mouth/Throat: Oropharynx is clear and moist.  Eyes: Conjunctivae normal are normal. Pupils are equal, round, and reactive to light.  Neck: Normal range of motion. Neck supple. No JVD present.  Cardiovascular: Normal rate, regular rhythm, S1 normal, S2 normal, normal heart sounds and intact distal pulses.  Exam reveals no gallop and no friction rub.   No murmur heard. Pulmonary/Chest: Effort normal and breath sounds normal. No respiratory distress. He has no wheezes. He has no rales. He exhibits no  tenderness.  Abdominal: Soft. Bowel sounds are normal. He exhibits no distension. There is no tenderness.  Musculoskeletal: Normal range of motion. He exhibits no edema and no tenderness.  Lymphadenopathy:    He has no cervical adenopathy.  Neurological: He is alert and oriented to person, place, and time. Coordination normal.  Skin: Skin is warm and dry. No rash noted. No erythema.  Psychiatric: He has a normal mood and affect. His behavior is normal. Judgment and thought content normal.           Assessment and Plan

## 2012-03-12 NOTE — Assessment & Plan Note (Addendum)
He likely has severe myalgias exacerbated by Lipitor . We have suggested he wait at least one week and then  restart low-dose pravastatin, Slowly titrating upwards back to pravastatin 40 mg daily. He is losing weight by watching his diet. I suggested he have his Crestor recheck on pravastatin 40 and if not at goal, he could add zetia, TriCor or WelChol.

## 2012-03-13 ENCOUNTER — Ambulatory Visit: Payer: 59 | Admitting: Cardiovascular Disease

## 2012-03-22 ENCOUNTER — Other Ambulatory Visit: Payer: Self-pay | Admitting: *Deleted

## 2012-03-22 NOTE — Telephone Encounter (Signed)
Error

## 2012-03-22 NOTE — Telephone Encounter (Signed)
LMTCB question concerning how pt is currently taking his pravastatin and dosage.

## 2012-03-26 ENCOUNTER — Ambulatory Visit: Payer: 59 | Admitting: Cardiovascular Disease

## 2012-03-27 ENCOUNTER — Other Ambulatory Visit: Payer: Self-pay | Admitting: *Deleted

## 2012-03-27 MED ORDER — PRAVASTATIN SODIUM 40 MG PO TABS: ORAL_TABLET | ORAL | Status: DC

## 2012-03-27 NOTE — Telephone Encounter (Signed)
Refilled Pravastatin

## 2012-03-28 ENCOUNTER — Ambulatory Visit: Payer: 59 | Admitting: Cardiovascular Disease

## 2012-04-01 ENCOUNTER — Ambulatory Visit: Payer: 59 | Admitting: Cardiovascular Disease

## 2012-04-08 ENCOUNTER — Encounter: Payer: Self-pay | Admitting: Internal Medicine

## 2012-04-08 ENCOUNTER — Ambulatory Visit (INDEPENDENT_AMBULATORY_CARE_PROVIDER_SITE_OTHER): Payer: 59 | Admitting: Internal Medicine

## 2012-04-08 VITALS — BP 140/90 | HR 58 | Temp 98.7°F | Ht 74.0 in | Wt 269.5 lb

## 2012-04-08 DIAGNOSIS — Z23 Encounter for immunization: Secondary | ICD-10-CM

## 2012-04-08 DIAGNOSIS — M791 Myalgia, unspecified site: Secondary | ICD-10-CM | POA: Insufficient documentation

## 2012-04-08 DIAGNOSIS — I10 Essential (primary) hypertension: Secondary | ICD-10-CM

## 2012-04-08 DIAGNOSIS — E7800 Pure hypercholesterolemia, unspecified: Secondary | ICD-10-CM

## 2012-04-08 DIAGNOSIS — Z1211 Encounter for screening for malignant neoplasm of colon: Secondary | ICD-10-CM

## 2012-04-08 DIAGNOSIS — I2510 Atherosclerotic heart disease of native coronary artery without angina pectoris: Secondary | ICD-10-CM

## 2012-04-08 DIAGNOSIS — E119 Type 2 diabetes mellitus without complications: Secondary | ICD-10-CM | POA: Insufficient documentation

## 2012-04-08 DIAGNOSIS — N19 Unspecified kidney failure: Secondary | ICD-10-CM

## 2012-04-08 DIAGNOSIS — IMO0001 Reserved for inherently not codable concepts without codable children: Secondary | ICD-10-CM

## 2012-04-08 MED ORDER — METFORMIN HCL ER 500 MG PO TB24: 500.0000 mg | ORAL_TABLET | Freq: Every day | ORAL | Status: DC

## 2012-04-08 NOTE — Assessment & Plan Note (Signed)
On pravastatin. Will repeat lipids and LFTs with labs today.  Goal LDL <70.

## 2012-04-08 NOTE — Assessment & Plan Note (Signed)
Patient with myalgia in the right greater than left leg. Will check labs including CMP, CK, TSH. Patient was changed from Lipitor to pravastatin with some improvement. Question if myalgia may be related to use of metformin. Will try tapering dose to 500 mg daily. Followup in 4 weeks.

## 2012-04-08 NOTE — Assessment & Plan Note (Signed)
Blood pressure well-controlled on current medications. No changes to medications today. Will check renal function with labs.

## 2012-04-08 NOTE — Progress Notes (Signed)
Subjective:    Patient ID: Samuel Navarro, male    DOB: September 09, 1960, 51 y.o.   MRN: [id number]  HPI 51 year old male with history of coronary artery disease, diabetes, hypertension, hyperlipidemia presents to establish care. He reports that he recently moved to [place] from [place]. He reports that he is generally doing well and recently has not had any chest pain, shortness of breath, or palpitations. He is followed by cardiology. He did have some issues with muscle pain after starting on Lipitor. He was changed back to pravastatin and pain improves somewhat. He continues to have some pain described as aching in his right greater than left leg. He questions whether this may be related to use of metformin as his dose was recently increased to 750 mg daily. He reports that blood sugars have been very well-controlled. He is following a healthy diet and is very active in his job.  Outpatient Encounter Prescriptions as of 04/08/2012  Medication Sig Dispense Refill  . amLODipine (NORVASC) 5 MG tablet Take 1 tablet (5 mg total) by mouth daily.  180 tablet  3  . aspirin 81 MG tablet Take 81 mg by mouth daily.      . carvedilol (COREG) 25 MG tablet Take 1 tablet (25 mg total) by mouth 2 (two) times daily.  30 tablet  6  . clopidogrel (PLAVIX) 75 MG tablet Take 75 mg by mouth daily.      . hydrochlorothiazide (HYDRODIURIL) 25 MG tablet Take 1 tablet (25 mg total) by mouth daily.  90 tablet  3  . isosorbide mononitrate (IMDUR) 30 MG 24 hr tablet Take 1 tablet (30 mg total) by mouth daily.  90 tablet  3  . lisinopril (PRINIVIL,ZESTRIL) 40 MG tablet Take 40 mg by mouth daily.      . pravastatin (PRAVACHOL) 40 MG tablet Take 1/4 tablet daily.  90 tablet  3  . [DISCONTINUED] metFORMIN (GLUCOPHAGE-XR) 750 MG 24 hr tablet Take 1 tablet (750 mg total) by mouth daily with breakfast.  30 tablet  1  . metFORMIN (GLUCOPHAGE-XR) 500 MG 24 hr tablet Take 1 tablet (500 mg total) by mouth daily with breakfast.  30  tablet  6  . [DISCONTINUED] atorvastatin (LIPITOR) 40 MG tablet Take 1 tablet (40 mg total) by mouth at bedtime.  90 tablet  1   BP 140/90  Pulse 58  Temp 98.7 F (37.1 C) (Oral)  Ht 6\' 2"  (1.88 m)  Wt 269 lb 8 oz (122.244 kg)  BMI 34.60 kg/m2  SpO2 98%  Review of Systems  Constitutional: Negative for fever, chills, activity change, appetite change, fatigue and unexpected weight change.  Eyes: Negative for visual disturbance.  Respiratory: Negative for cough and shortness of breath.   Cardiovascular: Negative for chest pain, palpitations and leg swelling.  Gastrointestinal: Negative for abdominal pain and abdominal distention.  Genitourinary: Negative for dysuria, urgency and difficulty urinating.  Musculoskeletal: Positive for myalgias. Negative for arthralgias and gait problem.  Skin: Negative for color change and rash.  Hematological: Negative for adenopathy.  Psychiatric/Behavioral: Negative for sleep disturbance and dysphoric mood. The patient is not nervous/anxious.        Objective:   Physical Exam  Constitutional: He is oriented to person, place, and time. He appears well-developed and well-nourished. No distress.  HENT:  Head: Normocephalic and atraumatic.  Right Ear: External ear normal.  Left Ear: External ear normal.  Nose: Nose normal.  Mouth/Throat: Oropharynx is clear and moist. No oropharyngeal exudate.  Eyes: Conjunctivae normal  and EOM are normal. Pupils are equal, round, and reactive to light. Right eye exhibits no discharge. Left eye exhibits no discharge. No scleral icterus.  Neck: Normal range of motion. Neck supple. No tracheal deviation present. No thyromegaly present.  Cardiovascular: Normal rate, regular rhythm and normal heart sounds.  Exam reveals no gallop and no friction rub.   No murmur heard. Pulmonary/Chest: Effort normal and breath sounds normal. No respiratory distress. He has no wheezes. He has no rales. He exhibits no tenderness.    Abdominal: Soft. Bowel sounds are normal. He exhibits no distension. There is no tenderness.  Musculoskeletal: Normal range of motion. He exhibits no edema.  Lymphadenopathy:    He has no cervical adenopathy.  Neurological: He is alert and oriented to person, place, and time. No cranial nerve deficit. Coordination normal.  Skin: Skin is warm and dry. No rash noted. He is not diaphoretic. No erythema. No pallor.  Psychiatric: He has a normal mood and affect. His behavior is normal. Judgment and thought content normal.          Assessment & Plan:

## 2012-04-08 NOTE — Assessment & Plan Note (Signed)
Blood sugars well controlled on metformin alone. Patient having some increased myalgia. Question if this may be related to metformin. Will decrease dose of metformin to 500 mg daily. Will repeat renal function and A1c with labs today.

## 2012-04-08 NOTE — Assessment & Plan Note (Signed)
Currently asymptomatic Continue current medications 

## 2012-04-09 ENCOUNTER — Encounter: Payer: Self-pay | Admitting: Internal Medicine

## 2012-04-09 LAB — COMPREHENSIVE METABOLIC PANEL
ALT: 13 U/L (ref 0–53)
AST: 19 U/L (ref 0–37)
Albumin: 4.2 g/dL (ref 3.5–5.2)
CO2: 30 mEq/L (ref 19–32)
Calcium: 9.4 mg/dL (ref 8.4–10.5)
Chloride: 102 mEq/L (ref 96–112)
Creatinine, Ser: 0.8 mg/dL (ref 0.4–1.5)
GFR: 106.95 mL/min (ref >60.00)
Potassium: 3.9 mEq/L (ref 3.5–5.1)
Sodium: 140 mEq/L (ref 135–145)
Total Protein: 7.7 g/dL (ref 6.0–8.3)

## 2012-04-09 LAB — VITAMIN B12: Vitamin B-12: 191 pg/mL — ABNORMAL LOW (ref 211–911)

## 2012-04-09 LAB — LDL CHOLESTEROL, DIRECT: Direct LDL: 130.7 mg/dL

## 2012-04-09 LAB — LIPID PANEL: Triglycerides: 223 mg/dL — ABNORMAL HIGH (ref 0.0–149.0)

## 2012-04-10 ENCOUNTER — Ambulatory Visit: Payer: 59

## 2012-04-11 ENCOUNTER — Ambulatory Visit (INDEPENDENT_AMBULATORY_CARE_PROVIDER_SITE_OTHER): Payer: 59 | Admitting: *Deleted

## 2012-04-11 DIAGNOSIS — E538 Deficiency of other specified B group vitamins: Secondary | ICD-10-CM

## 2012-04-11 MED ORDER — CYANOCOBALAMIN 1000 MCG/ML IJ SOLN
1000.0000 ug | Freq: Once | INTRAMUSCULAR | Status: AC
  Administered 2012-04-11: 1000 ug via INTRAMUSCULAR

## 2012-04-18 ENCOUNTER — Ambulatory Visit: Payer: 59

## 2012-05-13 ENCOUNTER — Encounter: Payer: Self-pay | Admitting: Cardiovascular Disease

## 2012-05-13 ENCOUNTER — Ambulatory Visit (INDEPENDENT_AMBULATORY_CARE_PROVIDER_SITE_OTHER): Payer: 59 | Admitting: Cardiovascular Disease

## 2012-05-13 VITALS — BP 140/90 | HR 61 | Ht 74.0 in | Wt 267.0 lb

## 2012-05-13 DIAGNOSIS — Z720 Tobacco use: Secondary | ICD-10-CM | POA: Insufficient documentation

## 2012-05-13 DIAGNOSIS — E7800 Pure hypercholesterolemia, unspecified: Secondary | ICD-10-CM

## 2012-05-13 DIAGNOSIS — I10 Essential (primary) hypertension: Secondary | ICD-10-CM

## 2012-05-13 DIAGNOSIS — F17200 Nicotine dependence, unspecified, uncomplicated: Secondary | ICD-10-CM

## 2012-05-13 DIAGNOSIS — I2510 Atherosclerotic heart disease of native coronary artery without angina pectoris: Secondary | ICD-10-CM

## 2012-05-13 MED ORDER — PRAVASTATIN SODIUM 40 MG PO TABS: 40.0000 mg | ORAL_TABLET | Freq: Every evening | ORAL | Status: DC

## 2012-05-13 NOTE — Assessment & Plan Note (Signed)
The patient is doing well from a cardiac standpoint. He has no symptoms suggestive of angina. Continue dual antiplatelet therapy.

## 2012-05-13 NOTE — Patient Instructions (Addendum)
Stop Crestor.  Resume Pravastatin 40 mg daily.  Follow up in 6 months.

## 2012-05-13 NOTE — Assessment & Plan Note (Signed)
Blood pressure is reasonably controlled over the last few visits. Continue current medications.

## 2012-05-13 NOTE — Progress Notes (Signed)
HPI  This is a 51 year old man who is here today for a followup visit. He is known to have coronary artery disease with 2 previous stents in 2006, refractory hypertension without evidence of renal artery stenosis and hospital admission this year for acute renal failure due to volume depletion.  He had worsening angina this year. He underwent cardiac catheterization which showed : Left main 20% stenosis, LAD 40% stenosis, 70% ostial disease in a small diagonal and OM 2, patent stent in OM 3 and mid RCA without significant restenosis, 85% stenosis in the distal RCA which was treated with a 2.5 x 12 mm DES, occluded posterior AV groove with good collaterals(chronic), EF 45%. His blood pressure has been more controlled than before. During his last visit to make a switch him from pravastatin to atorvastatin. However, he had significant back and lower extremity muscle pain. It was felt to be due to atorvastatin. His symptoms improved after stopping the medication. He was given Crestor 10 mg by Dr. Dan Humphreys. He is tolerating this medication but cannot afford it.  Allergies  Allergen Reactions  . Lipitor (Atorvastatin) Other (See Comments)    Joint pain, muscle aches     Current Outpatient Prescriptions on File Prior to Visit  Medication Sig Dispense Refill  . amLODipine (NORVASC) 5 MG tablet Take 1 tablet (5 mg total) by mouth daily.  180 tablet  3  . aspirin 81 MG tablet Take 81 mg by mouth daily.      . carvedilol (COREG) 25 MG tablet Take 1 tablet (25 mg total) by mouth 2 (two) times daily.  30 tablet  6  . clopidogrel (PLAVIX) 75 MG tablet Take 75 mg by mouth daily.      . cyanocobalamin (,VITAMIN B-12,) 1000 MCG/ML injection One injection once a week for three weeks, then monthly      . hydrochlorothiazide (HYDRODIURIL) 25 MG tablet Take 1 tablet (25 mg total) by mouth daily.  90 tablet  3  . isosorbide mononitrate (IMDUR) 30 MG 24 hr tablet Take 1 tablet (30 mg total) by mouth daily.  90  tablet  3  . lisinopril (PRINIVIL,ZESTRIL) 40 MG tablet Take 40 mg by mouth daily.      . metFORMIN (GLUCOPHAGE-XR) 500 MG 24 hr tablet Take 1 tablet (500 mg total) by mouth daily with breakfast.  30 tablet  6     Past Medical History  Diagnosis Date  . Unspecified gastritis and gastroduodenitis without mention of hemorrhage   . Sebaceous cyst     multiple on face, scalp, and trunk (side, left) per Dr Juanetta Gosling 09/25/11, Referral to [facility]  . DM type 2 (diabetes mellitus, type 2) 1998    non-insulin depedent  . Hypercholesterolemia   . HTN (hypertension)     since he was 51 y/o ?!!  . Sleep apnea     s/p ENT surgery more than 10 years ago.  . Cyst 2013    Removed from scalp & eye  . Acute kidney failure 2013  . Kidney failure   . Coronary artery disease     2 previous stents in [place] in 2006. Cardiac cath in August 2013 at [facility] showed: Left main 20% stenosis, LAD 40% stenosis, 70% ostial disease in the small diagonal and OM 2, patent stent in OM 3 and mid RCA without significant restenosis, 85% stenosis in the distal RCA which was treated with a 2.5 x 12 mm DES, occluded posterior AV groove with good  collaterals(chronic), EF 45%.     Past Surgical History  Procedure Date  . Coronary stent placement 2002, 2006    coronary artery stent  . Acne cyst removal 2013    x3  . Cardiac catheterization 2013    @ [facility]  . Uvulopalatopharyngoplasty (uppp)/tonsillectomy/septoplasty 2000    for sleep apnea     Family History  Problem Relation Age of Onset  . Heart disease Father   . Heart disease Brother   . Cancer Maternal Grandfather     lung     History   Social History  . Marital Status: Married    Spouse Name: N/A    Number of Children: 1  . Years of Education: 12th grade   Occupational History  . paper delivery    Social History Main Topics  . Smoking status: Never Smoker   . Smokeless tobacco: Current User    Types: Chew  . Alcohol Use: Yes      Comment: once or twice weekly  . Drug Use: No  . Sexually Active: Not on file   Other Topics Concern  . Not on file   Social History Narrative   Lives at home with his wife. Cat in home. From [place].Work Investment banker, operational for office suppliesDiet - regularExercise - walks at work     PHYSICAL EXAM   BP 140/90  Pulse 61  Ht 6\' 2"  (1.88 m)  Wt 267 lb (121.11 kg)  BMI 34.28 kg/m2 Constitutional: He is oriented to person, place, and time. He appears well-developed and well-nourished. No distress.  HENT: No nasal discharge.  Head: Normocephalic and atraumatic.  Eyes: Pupils are equal and round. Right eye exhibits no discharge. Left eye exhibits no discharge.  Neck: Normal range of motion. Neck supple. No JVD present. No thyromegaly present.  Cardiovascular: Normal rate, regular rhythm, normal heart sounds and. Exam reveals no gallop and no friction rub. No murmur heard.  Pulmonary/Chest: Effort normal and breath sounds normal. No stridor. No respiratory distress. He has no wheezes. He has no rales. He exhibits no tenderness.  Abdominal: Soft. Bowel sounds are normal. He exhibits no distension. There is no tenderness. There is no rebound and no guarding.  Musculoskeletal: Normal range of motion. He exhibits no edema and no tenderness.  Neurological: He is alert and oriented to person, place, and time. Coordination normal.  Skin: Skin is warm and dry. No rash noted. He is not diaphoretic. No erythema. No pallor.  Psychiatric: He has a normal mood and affect. His behavior is normal. Judgment and thought content normal.  Right radial pulse is normal with no hematoma.   EKG: Sinus  Rhythm  -Nonspecific QRS widening.   -  Nonspecific T-abnormality.   ABNORMAL    ASSESSMENT AND PLAN

## 2012-05-13 NOTE — Assessment & Plan Note (Signed)
Samuel Navarro chews tobacco but does not smoke. We discussed today the importance of stopping using nicotine products. He is interested in nicotine gum to help him quit. We will try to prescribe this once we determine his insurance coverage for this.

## 2012-05-13 NOTE — Assessment & Plan Note (Signed)
Unfortunately, the patient did not tolerate atorvastatin due to myalgia. His insurance does not cover Crestor. Thus, I will switch him back to pravastatin 40 mg daily.

## 2012-11-12 ENCOUNTER — Other Ambulatory Visit: Payer: Self-pay | Admitting: *Deleted

## 2012-11-12 ENCOUNTER — Encounter: Payer: Self-pay | Admitting: Physician Assistant

## 2012-11-12 ENCOUNTER — Ambulatory Visit (INDEPENDENT_AMBULATORY_CARE_PROVIDER_SITE_OTHER): Payer: 59 | Admitting: Physician Assistant

## 2012-11-12 ENCOUNTER — Telehealth: Payer: Self-pay

## 2012-11-12 VITALS — BP 160/96 | HR 60 | Ht 74.0 in | Wt 273.0 lb

## 2012-11-12 DIAGNOSIS — I2510 Atherosclerotic heart disease of native coronary artery without angina pectoris: Secondary | ICD-10-CM

## 2012-11-12 DIAGNOSIS — R5381 Other malaise: Secondary | ICD-10-CM

## 2012-11-12 DIAGNOSIS — Z720 Tobacco use: Secondary | ICD-10-CM

## 2012-11-12 DIAGNOSIS — F17200 Nicotine dependence, unspecified, uncomplicated: Secondary | ICD-10-CM

## 2012-11-12 DIAGNOSIS — M791 Myalgia, unspecified site: Secondary | ICD-10-CM

## 2012-11-12 DIAGNOSIS — E119 Type 2 diabetes mellitus without complications: Secondary | ICD-10-CM

## 2012-11-12 DIAGNOSIS — E7800 Pure hypercholesterolemia, unspecified: Secondary | ICD-10-CM

## 2012-11-12 DIAGNOSIS — I10 Essential (primary) hypertension: Secondary | ICD-10-CM

## 2012-11-12 DIAGNOSIS — IMO0001 Reserved for inherently not codable concepts without codable children: Secondary | ICD-10-CM

## 2012-11-12 DIAGNOSIS — R5383 Other fatigue: Secondary | ICD-10-CM | POA: Insufficient documentation

## 2012-11-12 MED ORDER — HYDROCHLOROTHIAZIDE 25 MG PO TABS: 25.0000 mg | ORAL_TABLET | Freq: Every day | ORAL | Status: DC

## 2012-11-12 MED ORDER — CARVEDILOL 25 MG PO TABS: 25.0000 mg | ORAL_TABLET | Freq: Two times a day (BID) | ORAL | Status: DC

## 2012-11-12 MED ORDER — METFORMIN HCL ER 500 MG PO TB24: 500.0000 mg | ORAL_TABLET | Freq: Every day | ORAL | Status: DC

## 2012-11-12 MED ORDER — SIMVASTATIN 10 MG PO TABS: 10.0000 mg | ORAL_TABLET | Freq: Every evening | ORAL | Status: DC

## 2012-11-12 MED ORDER — AMLODIPINE BESYLATE 10 MG PO TABS: 10.0000 mg | ORAL_TABLET | Freq: Every day | ORAL | Status: DC

## 2012-11-12 NOTE — Assessment & Plan Note (Signed)
The patient's complaints of fatigue are somewhat vague, however he did present in a similar, albeit milder manner, last year prior to undergoing PCI. Will plan to check CMET, CBC, TSH today. Will schedule for Lexiscan Myoview early next week.

## 2012-11-12 NOTE — Assessment & Plan Note (Signed)
Continue ASA, Plavix, ACEi, BB, statin, NTG SL PRN. Will plan outpatient stress testing early next week.

## 2012-11-12 NOTE — Assessment & Plan Note (Signed)
Pravastatin has been replaced with simvastatin. Will check a lipid panel tomorrow AM. He will need LFTs checked in 6 weeks.

## 2012-11-12 NOTE — Telephone Encounter (Signed)
Wife states pt is c/o vague symptoms of "just don't feel right" associated with extreme fatigue Denies CP or SOB Has significant cardiac hx and feels pt should be seen today Would like appt with Dr. Kirke Corin today I explained he has no openings but the PA has opening today at 2:30 She says pt is very "picky" about who he sees and asks if Dr. Mariah Milling is available I explained Dr. Mariah Milling has no openings today and should probably take appt with PA since he is having problems Understanding verb Will come today at 2:30 to see Samuel Navarro, Samuel Navarro

## 2012-11-12 NOTE — Assessment & Plan Note (Addendum)
There was some confusion as to the dose of Metformin the patient was supposed to be taking. Last office note per Dr. Dan Humphreys indicates 500mg  of Metformin ER. Will reduce dose to this. Check Hgb A1C tomorrow AM.

## 2012-11-12 NOTE — Assessment & Plan Note (Signed)
Continues to chew with no desire to quit. Will call if assistance requested.

## 2012-11-12 NOTE — Patient Instructions (Addendum)
We will obtain labwork today.   We will arrange for a stress test preferably on 11/18/12 at 11:45 AM.  Please take Zocor as prescribed.   We will increase amlodipine to 10mg  daily.   Please follow-up with Dr. Dan Humphreys in 1-2 weeks.   We will see you back in the office in 1 month.

## 2012-11-12 NOTE — Assessment & Plan Note (Signed)
Intolerant of atorvastatin, rosuvastatin and pravastatin. Will try low-dose simvastatin given underlying CAD and multiple PCIs.

## 2012-11-12 NOTE — Progress Notes (Signed)
Patient ID: Samuel Navarro, male   DOB: 02/20/1961, 52 y.o.   MRN: [id number]            Date:  11/12/2012   ID:  Samuel Navarro, DOB 1961/04/28, MRN [id number]  PCP:  Wynona Dove, MD  Primary Cardiologist:  Judie Petit. Kirke Corin, MD   History of Present Illness: Samuel Navarro is a 52 y.o. 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   He had worsening angina last year leading to diagnostic cardiac catheterization 01/2012 showing left main 20% stenosis, LAD 40% stenosis, 70% ostial disease in a small diagonal and OM 2, patent stent in OM 3 and mid RCA without significant restenosis, 85% stenosis in the distal RCA which was treated with a 2.5 x 12 mm DES, occluded posterior AV groove with good collaterals(chronic), EF 45%.   He has had an intolerance to atorvastatin due to back and lower extremity muscle pain. He was switched to rosuvastatin but was unable to afford this, and subsequent switched to pravastatin.   Last echo 10/2011: EF 55-65%, mild LV dilatation, mild concentric LVH, septal wall motion conduction abnormality, mild AI, mild MR (? chordal rupture), mod LA dilatation, RVSP/PASP WNL.  He reports being in his USOH until about the past month or so when has noticed increased fatigue. This is most noticeable after work at the end of the day described as "just not feeling right." He works as a [employer] fast streamer, and notes no impairment to his daily work activities which includes strenuous activity. He denies any chest pain, SOB/DOE, PND, orthopnea, palpitations, lightheadedness or syncope. He denies fevers,  chills, n/v/d. Wife denies snoring or apneic episodes. Mild weight gain.   Continues to take ASA/Plavix. He stopped pravastatin due to cramps. He has stopped carvedilol PM dose believing it was causing cramps. These have improved.   He continues to chew tobacco and is not interested in quitting.   EKG: NSR, 60 bpm, LVH, QRS 144 msec, downsloping ST depressions with TWIs V5, V6, TWI I, aVL, flat isolated ST depression (1 mm) II  Wt Readings from Last 3 Encounters:  11/12/12 123.832 kg (273 lb)  05/13/12 121.11 kg (267 lb)  04/08/12 122.244 kg (269 lb 8 oz)     Past Medical History  Diagnosis Date  . Unspecified gastritis and gastroduodenitis without mention of hemorrhage   . Sebaceous cyst     multiple on face, scalp, and trunk (side, left) per Dr Juanetta Gosling 09/25/11, Referral to [facility]  . DM type 2 (diabetes mellitus, type 2) 1998    non-insulin depedent  . Hypercholesterolemia   . HTN (hypertension)     since he was 52 y/o ?!!  . Sleep apnea     s/p ENT surgery more than 10 years ago.  . Cyst 2013    Removed from scalp & eye  . Acute kidney failure 2013  . Kidney failure   . Coronary artery disease     2 previous stents in [place] in 2006. Cardiac cath in August 2013 at [facility] showed: Left main 20% stenosis, LAD 40% stenosis, 70% ostial disease in the small diagonal and  OM 2, patent stent in OM 3 and mid RCA without significant restenosis, 85% stenosis in the distal RCA which was treated with a 2.5 x 12 mm DES, occluded posterior AV groove with good collaterals(chronic), EF 45%.    Current Outpatient Prescriptions  Medication Sig Dispense Refill  . amLODipine (NORVASC) 5 MG tablet Take 1 tablet (5 mg total) by mouth daily.  30 tablet  3  . aspirin 81 MG tablet Take 81 mg by mouth daily.      . carvedilol (COREG) 25 MG tablet Take 1 tablet (25 mg total) by mouth  daily with a meal.  30 tablet  3  . clopidogrel (PLAVIX) 75 MG tablet Take 75 mg by mouth daily.      .  hydrochlorothiazide (HYDRODIURIL) 25 MG tablet Take 1 tablet (25 mg total) by mouth daily.  90 tablet  3  . isosorbide mononitrate (IMDUR) 30 MG 24 hr tablet Take 1 tablet (30 mg total) by mouth daily.  90 tablet  3  . lisinopril (PRINIVIL,ZESTRIL) 40 MG tablet Take 40 mg by mouth daily.      . metFORMIN (GLUCOPHAGE-XR) 750 MG 24 hr tablet Take 1 tablet (750 mg total) by mouth daily with breakfast.  30 tablet  3  .     3   No current facility-administered medications for this visit.    Allergies:    Allergies  Allergen Reactions  . Lipitor (Atorvastatin) Other (See Comments)    Joint pain, muscle aches    Social History:  The patient  reports that he has never smoked. His smokeless tobacco use includes Chew. He reports that  drinks alcohol. He reports that he does not use illicit drugs.   Family History:  Family History  Problem Relation Age of Onset  . Heart disease Father   . Heart disease Brother   . Cancer Maternal Grandfather     lung    Review of Systems: General: positive for fatigue and mild weight increase, negative for chills, fever, night sweat Cardiovascular: negative for chest pain, dyspnea on exertion, edema, orthopnea, palpitations, paroxysmal nocturnal dyspnea or shortness of breath Dermatological:  negative for rash Respiratory:  negative for cough or wheezing Urologic:  negative for hematuria Abdominal: negative for nausea, vomiting, diarrhea, bright red blood per rectum, melena, or hematemesis Neurologic: negative for visual changes, syncope, or dizziness All other systems reviewed and are otherwise negative except as noted above.  PHYSICAL EXAM: VS:  BP 160/96  Pulse 60  Ht 6\' 2"  (1.88 m)  Wt 123.832 kg (273 lb)  BMI 35.04 kg/m2 Well nourished, well developed, in no acute distress HEENT: normal Neck: no JVD Cardiac:  normal S1, S2; RRR; no murmur Lungs:  clear to auscultation bilaterally, no wheezing, rhonchi or rales Abd: soft, nontender, no  hepatomegaly Ext: no edema Skin: warm and dry Neuro:  CNs 2-12 intact, no focal abnormalities noted

## 2012-11-12 NOTE — Telephone Encounter (Signed)
Refilled Hydrochlorothiazide sent to [facility].

## 2012-11-12 NOTE — Telephone Encounter (Signed)
Was sent to [facility] patient needs this to go by mail order, please call wife once this has been done per her request.

## 2012-11-12 NOTE — Assessment & Plan Note (Addendum)
Elevated in the office today. Will increase Norvasc to 10mg  daily. Continue current antihypertensives in ARB, diuretic and BB. He has resistant HTN. Negative RAS. Determine evaluation of secondary HTN on follow-up.

## 2012-11-13 ENCOUNTER — Ambulatory Visit (INDEPENDENT_AMBULATORY_CARE_PROVIDER_SITE_OTHER): Payer: 59

## 2012-11-13 ENCOUNTER — Telehealth: Payer: Self-pay

## 2012-11-13 DIAGNOSIS — E785 Hyperlipidemia, unspecified: Secondary | ICD-10-CM

## 2012-11-13 LAB — CBC
HCT: 44.3 % (ref 37.5–51.0)
Hemoglobin: 15.5 g/dL (ref 12.6–17.7)
MCHC: 35 g/dL (ref 31.5–35.7)
MCV: 90 fL (ref 79–97)
RDW: 13.7 % (ref 12.3–15.4)

## 2012-11-13 LAB — COMPREHENSIVE METABOLIC PANEL
Albumin/Globulin Ratio: 1.5 (ref 1.1–2.5)
Albumin: 4.4 g/dL (ref 3.5–5.5)
BUN: 18 mg/dL (ref 6–24)
Calcium: 10 mg/dL (ref 8.7–10.2)
Creatinine, Ser: 1.04 mg/dL (ref 0.76–1.27)
GFR calc Af Amer: 96 mL/min/{1.73_m2} (ref >59)
GFR calc non Af Amer: 83 mL/min/{1.73_m2} (ref >59)
Globulin, Total: 3 g/dL (ref 1.5–4.5)
Glucose: 150 mg/dL — ABNORMAL HIGH (ref 65–99)
Total Bilirubin: 0.4 mg/dL (ref 0.0–1.2)
Total Protein: 7.4 g/dL (ref 6.0–8.5)

## 2012-11-13 LAB — TSH: TSH: 0.865 u[IU]/mL (ref 0.450–4.500)

## 2012-11-13 NOTE — Telephone Encounter (Signed)
Wife informed of normal results

## 2012-11-13 NOTE — Telephone Encounter (Signed)
Pt wife would like results from yesterday. Please call

## 2012-11-14 LAB — LIPID PANEL
HDL: 40 mg/dL (ref >39)
LDL Calculated: 162 mg/dL — ABNORMAL HIGH (ref 0–99)
VLDL Cholesterol Cal: 30 mg/dL (ref 5–40)

## 2012-11-14 LAB — HEPATIC FUNCTION PANEL
ALT: 24 IU/L (ref 0–44)
AST: 26 IU/L (ref 0–40)
Total Bilirubin: 0.7 mg/dL (ref 0.0–1.2)

## 2012-11-18 ENCOUNTER — Ambulatory Visit: Payer: Self-pay | Admitting: Cardiovascular Disease

## 2012-11-18 ENCOUNTER — Encounter: Payer: Self-pay | Admitting: Cardiovascular Disease

## 2012-11-18 ENCOUNTER — Encounter: Payer: Self-pay | Admitting: Physician Assistant

## 2012-11-18 ENCOUNTER — Telehealth: Payer: Self-pay

## 2012-11-18 DIAGNOSIS — R079 Chest pain, unspecified: Secondary | ICD-10-CM

## 2012-11-18 NOTE — Telephone Encounter (Signed)
Message copied by Marcelle Overlie on Mon Nov 18, 2012  4:48 PM ------      Message from: Odella Aquas A      Created: Mon Nov 18, 2012  4:31 PM       Can we please inform Samuel Navarro that his stress test today looked normal. The strength of his heart was a bit weak (EF 45%), but unchanged from his heart cath in 01/2012. We'll keep his follow-up with Dr. Kirke Corin next month. Follow-up with his PCP for fatigue. Thanks! ------

## 2012-11-18 NOTE — Telephone Encounter (Signed)
See below

## 2012-11-19 NOTE — Telephone Encounter (Signed)
Wife informed Understanding verb 

## 2012-11-21 ENCOUNTER — Encounter: Payer: Self-pay | Admitting: Internal Medicine

## 2012-11-21 ENCOUNTER — Ambulatory Visit (INDEPENDENT_AMBULATORY_CARE_PROVIDER_SITE_OTHER): Payer: 59 | Admitting: Internal Medicine

## 2012-11-21 VITALS — BP 156/102 | HR 68 | Temp 98.2°F | Wt 270.0 lb

## 2012-11-21 DIAGNOSIS — E538 Deficiency of other specified B group vitamins: Secondary | ICD-10-CM

## 2012-11-21 DIAGNOSIS — E7800 Pure hypercholesterolemia, unspecified: Secondary | ICD-10-CM

## 2012-11-21 DIAGNOSIS — I70219 Atherosclerosis of native arteries of extremities with intermittent claudication, unspecified extremity: Secondary | ICD-10-CM | POA: Insufficient documentation

## 2012-11-21 DIAGNOSIS — R5381 Other malaise: Secondary | ICD-10-CM

## 2012-11-21 DIAGNOSIS — I10 Essential (primary) hypertension: Secondary | ICD-10-CM

## 2012-11-21 DIAGNOSIS — R5383 Other fatigue: Secondary | ICD-10-CM

## 2012-11-21 DIAGNOSIS — E119 Type 2 diabetes mellitus without complications: Secondary | ICD-10-CM

## 2012-11-21 MED ORDER — CYANOCOBALAMIN 1000 MCG/ML IJ SOLN
1000.0000 ug | Freq: Once | INTRAMUSCULAR | Status: AC
  Administered 2012-11-21: 1000 ug via INTRAMUSCULAR

## 2012-11-21 NOTE — Progress Notes (Signed)
Subjective:    Patient ID: Samuel Navarro, male    DOB: 09/08/1960, 52 y.o.   MRN: [id number]  HPI 52 year old male with history of coronary artery disease, diabetes, poorly controlled hypertension, hyperlipidemia, B12 deficiency presents for followup. He has been lost to followup since November 2013. He reports he has been feeling generally fatigued. He was recently evaluated by his cardiologist and had repeat stress test which was normal and showed stable ejection fraction at 45%. He denies any chest pain or shortness of breath. He is concerned about right lower leg pain with burning on his right lower lateral leg. This is sometimes improved by changing the position of his leg. The pain seems to get worse when he is walking. It is also sometimes worse at night.  He has not been checking his blood sugars. He is compliant with metformin.  Outpatient Encounter Prescriptions as of 11/21/2012  Medication Sig Dispense Refill  . amLODipine (NORVASC) 10 MG tablet Take 1 tablet (10 mg total) by mouth daily.  30 tablet  3  . aspirin 81 MG tablet Take 81 mg by mouth daily.      . carvedilol (COREG) 25 MG tablet Take 1 tablet (25 mg total) by mouth 2 (two) times daily with a meal.  30 tablet  3  . clopidogrel (PLAVIX) 75 MG tablet Take 75 mg by mouth daily.      . hydrochlorothiazide (HYDRODIURIL) 25 MG tablet Take 1 tablet (25 mg total) by mouth daily.  90 tablet  3  . isosorbide mononitrate (IMDUR) 30 MG 24 hr tablet Take 1 tablet (30 mg total) by mouth daily.  90 tablet  3  . lisinopril (PRINIVIL,ZESTRIL) 40 MG tablet Take 40 mg by mouth daily.      . metFORMIN (GLUCOPHAGE-XR) 500 MG 24 hr tablet Take 1 tablet (500 mg total) by mouth daily with breakfast.  30 tablet  3  . simvastatin (ZOCOR) 10 MG tablet Take 1 tablet (10 mg total) by mouth every evening.  30 tablet  3  . [EXPIRED] cyanocobalamin ((VITAMIN B-12)) injection 1,000 mcg        No facility-administered encounter medications on file as of  11/21/2012.   BP 156/102  Pulse 68  Temp(Src) 98.2 F (36.8 C) (Oral)  Wt 270 lb (122.471 kg)  BMI 34.65 kg/m2  SpO2 97%  Review of Systems  Constitutional: Positive for fatigue. Negative for fever, chills, activity change, appetite change and unexpected weight change.  Eyes: Negative for visual disturbance.  Respiratory: Negative for cough and shortness of breath.   Cardiovascular: Negative for chest pain, palpitations and leg swelling.  Gastrointestinal: Negative for abdominal pain and abdominal distention.  Genitourinary: Negative for dysuria, urgency and difficulty urinating.  Musculoskeletal: Positive for myalgias. Negative for arthralgias and gait problem.  Skin: Negative for color change and rash.  Hematological: Negative for adenopathy.  Psychiatric/Behavioral: Negative for sleep disturbance and dysphoric mood. The patient is not nervous/anxious.        Objective:   Physical Exam  Constitutional: He is oriented to person, place, and time. He appears well-developed and well-nourished. No distress.  HENT:  Head: Normocephalic and atraumatic.  Right Ear: External ear normal.  Left Ear: External ear normal.  Nose: Nose normal.  Mouth/Throat: Oropharynx is clear and moist. No oropharyngeal exudate.  Eyes: Conjunctivae and EOM are normal. Pupils are equal, round, and reactive to light. Right eye exhibits no discharge. Left eye exhibits no discharge. No scleral icterus.  Neck: Normal range of motion.  Neck supple. No tracheal deviation present. No thyromegaly present.  Cardiovascular: Normal rate, regular rhythm and normal heart sounds.  Exam reveals no gallop and no friction rub.   No murmur heard. Pulmonary/Chest: Effort normal and breath sounds normal. No respiratory distress. He has no wheezes. He has no rales. He exhibits no tenderness.  Musculoskeletal: Normal range of motion. He exhibits no edema.  Lymphadenopathy:    He has no cervical adenopathy.  Neurological: He is  alert and oriented to person, place, and time. No cranial nerve deficit. Coordination normal.  Skin: Skin is warm and dry. No rash noted. He is not diaphoretic. No erythema. No pallor.  Psychiatric: He has a normal mood and affect. His behavior is normal. Judgment and thought content normal.          Assessment & Plan:

## 2012-11-21 NOTE — Assessment & Plan Note (Signed)
Patient was recently started back on statin medication. Will recheck lipids and LFTs with labs in 2 weeks.

## 2012-11-21 NOTE — Assessment & Plan Note (Signed)
Lab Results  Component Value Date   HGBA1C 7.2* 04/08/2012   Patient is overdue for A1c checked. Will check A1c with a scheduled labs in 2 weeks. Continue metformin.

## 2012-11-21 NOTE — Assessment & Plan Note (Signed)
Persistent symptoms of fatigue. Recent stress test showed stable cardiac function. Question of recurrent sleep apnea could be playing a role. Patient does not wish to go through repeat sleep study. B12 was also noted to be very low in the past and has not been supplementing this. Encouraged him to continue with monthly B12 injections. B12 given today. Followup in 3 months or sooner as needed.

## 2012-11-21 NOTE — Assessment & Plan Note (Addendum)
BP Readings from Last 3 Encounters:  11/21/12 156/102  11/12/12 160/96  05/13/12 140/90   Blood pressure continues to be elevated despite increase in Norvasc to 10 mg daily. Renal artery ultrasound in the past was normal. Reviewed this today. Continue ARB, hydrochlorothiazide, carvedilol. Consider nephrology referral for evaluation of secondary hypertension. Will send SPEP, UPEP, ANA, urine catechol with labs.

## 2012-12-02 ENCOUNTER — Encounter: Payer: Self-pay | Admitting: Emergency Medicine

## 2012-12-02 ENCOUNTER — Encounter: Payer: Self-pay | Admitting: Internal Medicine

## 2012-12-13 ENCOUNTER — Telehealth: Payer: Self-pay | Admitting: Internal Medicine

## 2012-12-13 ENCOUNTER — Ambulatory Visit: Payer: 59 | Admitting: Cardiovascular Disease

## 2012-12-13 NOTE — Telephone Encounter (Signed)
Lower extremity arterial duplex was normal.

## 2012-12-26 ENCOUNTER — Encounter: Payer: Self-pay | Admitting: Internal Medicine

## 2013-01-03 ENCOUNTER — Ambulatory Visit (INDEPENDENT_AMBULATORY_CARE_PROVIDER_SITE_OTHER): Payer: 59 | Admitting: Cardiovascular Disease

## 2013-01-03 ENCOUNTER — Encounter: Payer: Self-pay | Admitting: Internal Medicine

## 2013-01-03 ENCOUNTER — Encounter: Payer: Self-pay | Admitting: Cardiovascular Disease

## 2013-01-03 ENCOUNTER — Other Ambulatory Visit: Payer: Self-pay | Admitting: *Deleted

## 2013-01-03 VITALS — BP 148/94 | HR 58 | Ht 74.0 in | Wt 276.2 lb

## 2013-01-03 DIAGNOSIS — Z720 Tobacco use: Secondary | ICD-10-CM

## 2013-01-03 DIAGNOSIS — I10 Essential (primary) hypertension: Secondary | ICD-10-CM

## 2013-01-03 DIAGNOSIS — F17200 Nicotine dependence, unspecified, uncomplicated: Secondary | ICD-10-CM

## 2013-01-03 DIAGNOSIS — E7800 Pure hypercholesterolemia, unspecified: Secondary | ICD-10-CM

## 2013-01-03 DIAGNOSIS — I2510 Atherosclerotic heart disease of native coronary artery without angina pectoris: Secondary | ICD-10-CM

## 2013-01-03 MED ORDER — AMLODIPINE BESYLATE 10 MG PO TABS: 10.0000 mg | ORAL_TABLET | Freq: Every day | ORAL | Status: AC

## 2013-01-03 MED ORDER — METFORMIN HCL ER 500 MG PO TB24: 500.0000 mg | ORAL_TABLET | Freq: Every day | ORAL | Status: DC

## 2013-01-03 MED ORDER — CARVEDILOL 25 MG PO TABS: 25.0000 mg | ORAL_TABLET | Freq: Two times a day (BID) | ORAL | Status: AC

## 2013-01-03 MED ORDER — SIMVASTATIN 10 MG PO TABS: 10.0000 mg | ORAL_TABLET | Freq: Every evening | ORAL | Status: DC

## 2013-01-03 MED ORDER — HYDROCHLOROTHIAZIDE 25 MG PO TABS: 25.0000 mg | ORAL_TABLET | Freq: Every day | ORAL | Status: DC

## 2013-01-03 NOTE — Patient Instructions (Addendum)
Continue same medications.  Follow up in 6 months.  

## 2013-01-03 NOTE — Progress Notes (Signed)
HPI  This is a 52 year old man who is here today for a followup visit. He is known to have coronary artery disease with 2 previous stents in 2006, refractory hypertension without evidence of renal artery stenosis and hospital admission in 2013 for acute renal failure due to volume depletion.  He had worsening angina last year. He underwent cardiac catheterization in 01/2012 which showed : Left main 20% stenosis, LAD 40% stenosis, 70% ostial disease in a small diagonal and OM 2, patent stent in OM 3 and mid RCA without significant restenosis, 85% stenosis in the distal RCA which was treated with a 2.5 x 12 mm DES, occluded posterior AV groove with good collaterals(chronic), EF 45%. He has intolerance to multiple statins due to myalgia. He also has previous history of sleep apnea but had corrective surgery done with no evidence of recurrence.  He was seen in June by Alinda Money for worsening fatigue. He underwent a nuclear stress test which showed no evidence of ischemia with mildly reduced systolic function. Overall, he is feeling better. He is trying to quit chewing tobacco which he does heavily.  He is planning to get acupuncture.   Allergies  Allergen Reactions  . Lipitor (Atorvastatin) Other (See Comments)    Joint pain, muscle aches     Current Outpatient Prescriptions on File Prior to Visit  Medication Sig Dispense Refill  . aspirin 81 MG tablet Take 81 mg by mouth daily.      . clopidogrel (PLAVIX) 75 MG tablet Take 75 mg by mouth daily.      Marland Kitchen lisinopril (PRINIVIL,ZESTRIL) 40 MG tablet Take 40 mg by mouth daily.      . metFORMIN (GLUCOPHAGE-XR) 500 MG 24 hr tablet Take 1 tablet (500 mg total) by mouth daily with breakfast.  30 tablet  3   No current facility-administered medications on file prior to visit.     Past Medical History  Diagnosis Date  . Unspecified gastritis and gastroduodenitis without mention of hemorrhage   . Sebaceous cyst     multiple on face, scalp, and trunk  (side, left) per Dr Juanetta Gosling 09/25/11, Referral to [facility]  . DM type 2 (diabetes mellitus, type 2) 1998    non-insulin depedent  . Hypercholesterolemia   . HTN (hypertension)     since he was 52 y/o ?!!  . Sleep apnea     s/p ENT surgery more than 10 years ago.  . Cyst 2013    Removed from scalp & eye  . Acute kidney failure 2013  . Kidney failure   . Coronary artery disease     a. Two previous stents in [place] in 2006 b. Cardiac cath 01/2012 at [facility]: LM 20% stenosis, LAD 40% stenosis, 70% ostial disease in small diagonal and OM2, patent OM3 stent and mid RCA without significant restenosis, 85% stenosis distal RCA s/p 2.5 x 12 mm DES, posterior AV groove CTO with good collaterals; EF 45% c. Lexiscan Myoview 11/2012 low risk, no reversible ischemic changes; EF 45%  . Ischemic cardiomyopathy 01/2012    EF 45%  . Cardiomyopathy     Mixed hypertensive & ischemic     Past Surgical History  Procedure Laterality Date  . Coronary stent placement  2002, 2006    coronary artery stent  . Acne cyst removal  2013    x3  . Cardiac catheterization  2013    @ [facility]  . Uvulopalatopharyngoplasty (uppp)/tonsillectomy/septoplasty  2000    for sleep apnea  Family History  Problem Relation Age of Onset  . Heart disease Father   . Heart disease Brother   . Cancer Maternal Grandfather     lung     History   Social History  . Marital Status: Married    Spouse Name: N/A    Number of Children: 1  . Years of Education: 12th grade   Occupational History  . paper delivery    Social History Main Topics  . Smoking status: Never Smoker   . Smokeless tobacco: Current User    Types: Chew  . Alcohol Use: Yes     Comment: once or twice weekly  . Drug Use: No  . Sexually Active: Not on file   Other Topics Concern  . Not on file   Social History Narrative   Lives at home with his wife. Cat in home. From [place].      Work - Surveyor, minerals for office supplies      Diet -  regular      Exercise - walks at work     PHYSICAL EXAM   BP 148/94  Pulse 58  Ht 6\' 2"  (1.88 m)  Wt 276 lb 4 oz (125.306 kg)  BMI 35.45 kg/m2 Constitutional: He is oriented to person, place, and time. He appears well-developed and well-nourished. No distress.  HENT: No nasal discharge.  Head: Normocephalic and atraumatic.  Eyes: Pupils are equal and round. Right eye exhibits no discharge. Left eye exhibits no discharge.  Neck: Normal range of motion. Neck supple. No JVD present. No thyromegaly present.  Cardiovascular: Normal rate, regular rhythm, normal heart sounds and. Exam reveals no gallop and no friction rub. No murmur heard.  Pulmonary/Chest: Effort normal and breath sounds normal. No stridor. No respiratory distress. He has no wheezes. He has no rales. He exhibits no tenderness.  Abdominal: Soft. Bowel sounds are normal. He exhibits no distension. There is no tenderness. There is no rebound and no guarding.  Musculoskeletal: Normal range of motion. He exhibits no edema and no tenderness.  Neurological: He is alert and oriented to person, place, and time. Coordination normal.  Skin: Skin is warm and dry. No rash noted. He is not diaphoretic. No erythema. No pallor.  Psychiatric: He has a normal mood and affect. His behavior is normal. Judgment and thought content normal.  Right radial pulse is normal with no hematoma.   ASSESSMENT AND PLAN

## 2013-01-03 NOTE — Assessment & Plan Note (Signed)
Recent stress test showed no evidence of ischemia. Continue medical therapy. He reports no chest pain or dyspnea at this time. Fatigue is likely multifactorial

## 2013-01-03 NOTE — Assessment & Plan Note (Signed)
Blood pressure is mildly elevated. He sometimes forgets to take carvedilol in the evening. I discussed with him the importance of adherence to medications and low sodium diet.

## 2013-01-03 NOTE — Assessment & Plan Note (Signed)
Lab Results  Component Value Date   CHOL 197 04/08/2012   HDL 40 11/13/2012   LDLCALC 162* 11/13/2012   LDLDIRECT 130.7 04/08/2012   TRIG 151* 11/13/2012   CHOLHDL 5.8* 11/13/2012   His recent LDL was 162. He was sent started on simvastatin 10 mg daily and seems to be tolerating the medication.

## 2013-01-03 NOTE — Assessment & Plan Note (Signed)
He chews tobacco heavily. I had a long discussion with him about quitting all tobacco products and he is determined to quit as this is now mandated by his insurance company in order to decrease his premium. I explained to him that there are multiple options quitting smoking including medications, nicotine replacement or electronic cigarettes.

## 2013-01-20 ENCOUNTER — Encounter: Payer: Self-pay | Admitting: Internal Medicine

## 2013-01-23 ENCOUNTER — Encounter: Payer: Self-pay | Admitting: Internal Medicine

## 2013-01-31 ENCOUNTER — Encounter: Payer: Self-pay | Admitting: Internal Medicine

## 2013-01-31 ENCOUNTER — Ambulatory Visit (INDEPENDENT_AMBULATORY_CARE_PROVIDER_SITE_OTHER): Payer: 59 | Admitting: Internal Medicine

## 2013-01-31 VITALS — BP 160/90 | HR 65 | Temp 98.5°F | Wt 273.0 lb

## 2013-01-31 DIAGNOSIS — E7800 Pure hypercholesterolemia, unspecified: Secondary | ICD-10-CM

## 2013-01-31 DIAGNOSIS — Z1211 Encounter for screening for malignant neoplasm of colon: Secondary | ICD-10-CM

## 2013-01-31 DIAGNOSIS — I10 Essential (primary) hypertension: Secondary | ICD-10-CM

## 2013-01-31 DIAGNOSIS — E119 Type 2 diabetes mellitus without complications: Secondary | ICD-10-CM

## 2013-01-31 MED ORDER — METFORMIN HCL ER 750 MG PO TB24: 750.0000 mg | ORAL_TABLET | Freq: Every day | ORAL | Status: DC

## 2013-01-31 NOTE — Assessment & Plan Note (Signed)
Recent A1c was elevated at 8.2% in the setting of increased intake of sugars. Encouraged better compliance with healthy diet. Will set up diabetes education. Will increase metformin to 750 mg daily. We discussed potentially adding Onglyza, however he would like to hold off for now as he thinks he can control blood sugars with diet and metformin. Plan to repeat A1c in November 2014. Foot exam normal today.

## 2013-01-31 NOTE — Assessment & Plan Note (Signed)
Plan to check lipids and LFTs with labs in November 2014. Recent triglyceride level was elevated on labs performed by his employer at [employer] however this was nonfasting sample in the setting of increased carbohydrate intake. Continue simvastatin.

## 2013-01-31 NOTE — Progress Notes (Signed)
Subjective:    Patient ID: Samuel Navarro, male    DOB: 1960-11-04, 52 y.o.   MRN: [id number]  HPI 52 year old male with history of coronary artery disease, hypertension, hyperlipidemia, diabetes presents for followup after recent labs performed by his employer showed elevated hemoglobin A1c of 8.2%. He reports that for the month prior to these labs he has abstained from using chewing tobacco because he was being tested for nicotine use. In place of chewing tobacco, he reports increased intake of candy and sweets in general. He did not check his blood sugar. However, he attributes elevated hemoglobin A1c 2 increased intake of sugars. Hemoglobin A1c in the past have been near 7%. He reports compliance with his metformin.  Outpatient Encounter Prescriptions as of 01/31/2013  Medication Sig Dispense Refill  . amLODipine (NORVASC) 10 MG tablet Take 1 tablet (10 mg total) by mouth daily.  90 tablet  3  . aspirin 81 MG tablet Take 81 mg by mouth daily.      . carvedilol (COREG) 25 MG tablet Take 1 tablet (25 mg total) by mouth 2 (two) times daily with a meal.  180 tablet  3  . clopidogrel (PLAVIX) 75 MG tablet Take 75 mg by mouth daily.      . hydrochlorothiazide (HYDRODIURIL) 25 MG tablet Take 1 tablet (25 mg total) by mouth daily.  90 tablet  3  . lisinopril (PRINIVIL,ZESTRIL) 40 MG tablet Take 40 mg by mouth daily.      . simvastatin (ZOCOR) 10 MG tablet Take 1 tablet (10 mg total) by mouth every evening.  90 tablet  3  . [DISCONTINUED] metFORMIN (GLUCOPHAGE-XR) 500 MG 24 hr tablet Take 1 tablet (500 mg total) by mouth daily with breakfast.  90 tablet  1  . metFORMIN (GLUCOPHAGE-XR) 750 MG 24 hr tablet Take 1 tablet (750 mg total) by mouth daily with breakfast.  90 tablet  0   No facility-administered encounter medications on file as of 01/31/2013.   BP 160/90  Pulse 65  Temp(Src) 98.5 F (36.9 C) (Oral)  Wt 273 lb (123.832 kg)  BMI 35.04 kg/m2  SpO2 97%  Review of Systems  Constitutional:  Negative for fever, chills, activity change, appetite change, fatigue and unexpected weight change.  Eyes: Negative for visual disturbance.  Respiratory: Negative for cough and shortness of breath.   Cardiovascular: Negative for chest pain, palpitations and leg swelling.  Gastrointestinal: Negative for abdominal pain and abdominal distention.  Genitourinary: Negative for dysuria, urgency and difficulty urinating.  Musculoskeletal: Negative for arthralgias and gait problem.  Skin: Negative for color change and rash.  Hematological: Negative for adenopathy.  Psychiatric/Behavioral: Negative for sleep disturbance and dysphoric mood. The patient is not nervous/anxious.        Objective:   Physical Exam  Constitutional: He is oriented to person, place, and time. He appears well-developed and well-nourished. No distress.  HENT:  Head: Normocephalic and atraumatic.  Right Ear: External ear normal.  Left Ear: External ear normal.  Nose: Nose normal.  Mouth/Throat: Oropharynx is clear and moist. No oropharyngeal exudate.  Eyes: Conjunctivae and EOM are normal. Pupils are equal, round, and reactive to light. Right eye exhibits no discharge. Left eye exhibits no discharge. No scleral icterus.  Neck: Normal range of motion. Neck supple. No tracheal deviation present. No thyromegaly present.  Cardiovascular: Normal rate, regular rhythm and normal heart sounds.  Exam reveals no gallop and no friction rub.   No murmur heard. Pulmonary/Chest: Effort normal and breath sounds normal. No  respiratory distress. He has no wheezes. He has no rales. He exhibits no tenderness.  Musculoskeletal: Normal range of motion. He exhibits no edema.  Lymphadenopathy:    He has no cervical adenopathy.  Neurological: He is alert and oriented to person, place, and time. No cranial nerve deficit. Coordination normal.  Skin: Skin is warm and dry. No rash noted. He is not diaphoretic. No erythema. No pallor.  Psychiatric: He  has a normal mood and affect. His behavior is normal. Judgment and thought content normal.          Assessment & Plan:

## 2013-01-31 NOTE — Assessment & Plan Note (Signed)
BP Readings from Last 3 Encounters:  01/31/13 160/90  01/03/13 148/94  11/21/12 156/102   Blood pressure elevated today however patient reports better control at home. We'll continue current medications. Patient will call if blood pressure consistently greater than 140/90.

## 2013-02-25 ENCOUNTER — Telehealth: Payer: Self-pay

## 2013-02-25 DIAGNOSIS — R079 Chest pain, unspecified: Secondary | ICD-10-CM

## 2013-02-25 NOTE — Telephone Encounter (Signed)
Pt wife called, states pt needs refill on Nitroglycerin

## 2013-02-25 NOTE — Telephone Encounter (Signed)
LMOM to have patient call regarding the refill for NTG.

## 2013-02-25 NOTE — Telephone Encounter (Signed)
Ok to refill NTG 0.4 mg sl prn #25 with 6 refills.  He should let us know if he gets more chest pain.

## 2013-02-25 NOTE — Telephone Encounter (Signed)
Spoke with pt wife and she mentioned that previous Dr. In [place] had gave pt nitrostat for chest pain. Pt went to take nitro last night due to chest discomfort and mentioned that he realized he didn't have any more refills left. Pt wife mentioned that he hasn't had to use nitro frequently only last night. Nitro is not on pt's med list . Pt is requesting refill on nitro is it ok for him to have refilled? Please advise.

## 2013-02-26 ENCOUNTER — Ambulatory Visit: Payer: 59 | Admitting: Internal Medicine

## 2013-02-26 MED ORDER — NITROGLYCERIN 0.4 MG SL SUBL: 0.4000 mg | SUBLINGUAL_TABLET | SUBLINGUAL | Status: DC | PRN

## 2013-02-26 MED ORDER — NITROGLYCERIN 0.4 MG SL SUBL: 0.4000 mg | SUBLINGUAL_TABLET | SUBLINGUAL | Status: AC | PRN

## 2013-02-26 NOTE — Telephone Encounter (Signed)
Spoke w/ pt's wife.  Informed her that I would be [facility] [address] Educated her on the proper use of nitro, as pt has been taking it out of glass container & carrying it with him in a metal container. Wife states that pt "knows the difference b/t heartburn and chest pain b/c he only gets a headache after taking nitro if it's heartburn." Educated wife on the side effects of nitro and for pt to read the enclosed instructions with his rx and to let me know if he does not understand so I can mail him info on nitro.

## 2013-03-04 ENCOUNTER — Ambulatory Visit: Payer: 59 | Admitting: Internal Medicine

## 2013-03-25 ENCOUNTER — Encounter: Payer: Self-pay | Admitting: Emergency Medicine

## 2013-04-10 ENCOUNTER — Other Ambulatory Visit: Payer: Self-pay

## 2013-05-05 ENCOUNTER — Encounter: Payer: Self-pay | Admitting: Internal Medicine

## 2013-05-06 ENCOUNTER — Ambulatory Visit: Payer: 59 | Admitting: Internal Medicine

## 2013-05-27 ENCOUNTER — Ambulatory Visit: Payer: 59 | Admitting: Internal Medicine

## 2013-07-01 ENCOUNTER — Ambulatory Visit: Payer: 59 | Admitting: Internal Medicine

## 2013-07-07 ENCOUNTER — Encounter: Payer: Self-pay | Admitting: Cardiovascular Disease

## 2013-07-07 ENCOUNTER — Ambulatory Visit (INDEPENDENT_AMBULATORY_CARE_PROVIDER_SITE_OTHER): Payer: 59 | Admitting: Cardiovascular Disease

## 2013-07-07 VITALS — BP 146/96 | HR 60 | Ht 74.0 in | Wt 274.2 lb

## 2013-07-07 DIAGNOSIS — Z720 Tobacco use: Secondary | ICD-10-CM

## 2013-07-07 DIAGNOSIS — I2510 Atherosclerotic heart disease of native coronary artery without angina pectoris: Secondary | ICD-10-CM

## 2013-07-07 DIAGNOSIS — Z23 Encounter for immunization: Secondary | ICD-10-CM

## 2013-07-07 DIAGNOSIS — F17200 Nicotine dependence, unspecified, uncomplicated: Secondary | ICD-10-CM

## 2013-07-07 DIAGNOSIS — E7800 Pure hypercholesterolemia, unspecified: Secondary | ICD-10-CM

## 2013-07-07 MED ORDER — LISINOPRIL 40 MG PO TABS: 40.0000 mg | ORAL_TABLET | Freq: Every day | ORAL | Status: AC

## 2013-07-07 MED ORDER — ISOSORBIDE MONONITRATE ER 30 MG PO TB24: 30.0000 mg | ORAL_TABLET | Freq: Every day | ORAL | Status: DC

## 2013-07-07 MED ORDER — CLOPIDOGREL BISULFATE 75 MG PO TABS
75.0000 mg | ORAL_TABLET | Freq: Every day | ORAL | Status: AC
Start: 2013-07-07 — End: ?

## 2013-07-07 NOTE — Assessment & Plan Note (Signed)
Blood pressure is reasonably controlled on current medications. 

## 2013-07-07 NOTE — Progress Notes (Signed)
Primary care physician: Dr. Ronna Polio  HPI  This is a 53 year old man who is here today for a followup visit. He is known to have coronary artery disease with 2 previous stents in 2006, refractory hypertension without evidence of renal artery stenosis and hospital admission in 2013 for acute renal failure due to volume depletion.  Most recent cardiac catheterization in 01/2012  showed : Left main 20% stenosis, LAD 40% stenosis, 70% ostial disease in a small diagonal and OM 2, patent stent in OM 3 and mid RCA without significant restenosis, 85% stenosis in the distal RCA which was treated with a 2.5 x 12 mm DES, occluded posterior AV groove with good collaterals(chronic), EF 45%. He has intolerance to multiple statins due to myalgia. He also has previous history of sleep apnea but had corrective surgery done with no evidence of recurrence.  Most recent nuclear stress test in June of 2014 showed no evidence of ischemia with mildly reduced systolic function. He continues to chew tobacco. He could not quit. He has been doing reasonably well and denies any chest pain or dyspnea.   Allergies  Allergen Reactions  . Lipitor [Atorvastatin] Other (See Comments)    Joint pain, muscle aches     Current Outpatient Prescriptions on File Prior to Visit  Medication Sig Dispense Refill  . amLODipine (NORVASC) 10 MG tablet Take 1 tablet (10 mg total) by mouth daily.  90 tablet  3  . aspirin 81 MG tablet Take 81 mg by mouth daily.      . carvedilol (COREG) 25 MG tablet Take 1 tablet (25 mg total) by mouth 2 (two) times daily with a meal.  180 tablet  3  . clopidogrel (PLAVIX) 75 MG tablet Take 75 mg by mouth daily.      . hydrochlorothiazide (HYDRODIURIL) 25 MG tablet Take 1 tablet (25 mg total) by mouth daily.  90 tablet  3  . lisinopril (PRINIVIL,ZESTRIL) 40 MG tablet Take 40 mg by mouth daily.      . metFORMIN (GLUCOPHAGE-XR) 750 MG 24 hr tablet Take 1 tablet (750 mg total) by mouth daily with  breakfast.  90 tablet  0  . nitroGLYCERIN (NITROSTAT) 0.4 MG SL tablet Place 1 tablet (0.4 mg total) under the tongue every 5 (five) minutes as needed for chest pain.  25 tablet  6  . simvastatin (ZOCOR) 10 MG tablet Take 1 tablet (10 mg total) by mouth every evening.  90 tablet  3   No current facility-administered medications on file prior to visit.     Past Medical History  Diagnosis Date  . Unspecified gastritis and gastroduodenitis without mention of hemorrhage   . Sebaceous cyst     multiple on face, scalp, and trunk (side, left) per Dr Juanetta Gosling 09/25/11, Referral to [facility]  . DM type 2 (diabetes mellitus, type 2) 1998    non-insulin depedent  . Hypercholesterolemia   . HTN (hypertension)     since he was 53 y/o ?!!  . Sleep apnea     s/p ENT surgery more than 10 years ago.  . Cyst 2013    Removed from scalp & eye  . Acute kidney failure 2013  . Kidney failure   . Coronary artery disease     a. Two previous stents in [place] in 2006 b. Cardiac cath 01/2012 at [facility]: LM 20% stenosis, LAD 40% stenosis, 70% ostial disease in small diagonal and OM2, patent OM3 stent and mid RCA without significant restenosis,  85% stenosis distal RCA s/p 2.5 x 12 mm DES, posterior AV groove CTO with good collaterals; EF 45% c. Lexiscan Myoview 11/2012 low risk, no reversible ischemic changes; EF 45%  . Ischemic cardiomyopathy 01/2012    EF 45%  . Cardiomyopathy     Mixed hypertensive & ischemic     Past Surgical History  Procedure Laterality Date  . Coronary stent placement  2002, 2006    coronary artery stent  . Acne cyst removal  2013    x3  . Cardiac [facility]ization  2013    @ [facility]  . Uvulopalatopharyngoplasty (uppp)/tonsillectomy/septoplasty  2000    for sleep apnea     Family History  Problem Relation Age of Onset  . Heart disease Father   . Heart disease Brother   . Cancer Maternal Grandfather     lung     History   Social History  . Marital Status: Married     Spouse Name: N/A    Number of Children: 1  . Years of Education: 12th grade   Occupational History  . paper delivery    Social History Main Topics  . Smoking status: Never Smoker   . Smokeless tobacco: Current User    Types: Chew  . Alcohol Use: Yes     Comment: once or twice weekly  . Drug Use: No  . Sexual Activity: Not on file   Other Topics Concern  . Not on file   Social History Narrative   Lives at home with his wife. Cat in home. From [place].      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 He appears well-developed and well-nourished. No distress.  HENT: No nasal discharge.  Head: Normocephalic and atraumatic.  Eyes: Pupils are equal and round. Right eye exhibits no discharge. Left eye exhibits no discharge.  Neck: Normal range of motion. Neck supple. No JVD present. No thyromegaly present.  Cardiovascular: Normal rate, regular rhythm, normal heart sounds and. Exam reveals no gallop and no friction rub. No murmur heard.  Pulmonary/Chest: Effort normal and breath sounds normal. No stridor. No respiratory distress. He has no wheezes. He has no rales. He exhibits no tenderness.  Abdominal: Soft. Bowel sounds are normal. He exhibits no distension. There is no tenderness. There is no rebound and no guarding.  Musculoskeletal: Normal range of motion. He exhibits no edema and no tenderness.  Neurological: He is alert and oriented to person, place, and time. Coordination normal.  Skin: Skin is warm and dry. No rash noted. He is not diaphoretic. No erythema. No pallor.  Psychiatric: He has a normal mood and affect. His behavior is normal. Judgment and thought content normal.   EKG: Normal sinus rhythm with left bundle branch block.  ASSESSMENT AND  PLAN

## 2013-07-07 NOTE — Assessment & Plan Note (Signed)
He continues to chew tobacco heavily. When he tried to quit, he became very anxious with increased appetite and significant worsening and diabetes and hyperlipidemia controlled.

## 2013-07-07 NOTE — Assessment & Plan Note (Addendum)
Lab Results  Component Value Date   CHOL 197 04/08/2012   HDL 40 11/13/2012   LDLCALC 162* 11/13/2012   LDLDIRECT 130.7 04/08/2012   TRIG 151* 11/13/2012   CHOLHDL 5.8* 11/13/2012   He is going for repeat fasting lipid and liver profile soon before his appointment with Dr. Dan HumphreysWalker. I recommend a target LDL of less than 70 if possible. There has been difficulty in the past due to myalgia. We might need to consider switching him to Crestor depending on the results.

## 2013-07-07 NOTE — Patient Instructions (Signed)
You received the flu shot today   Your physician wants you to follow-up in: 6 months. You will receive a reminder letter in the mail two months in advance. If you don't receive a letter, please call our office to schedule the follow-up appointment.

## 2013-07-07 NOTE — Assessment & Plan Note (Signed)
He Is doing reasonably well with no symptoms suggestive of angina. Continue medical therapy.

## 2013-07-09 ENCOUNTER — Encounter: Payer: Self-pay | Admitting: Cardiovascular Disease

## 2013-07-22 ENCOUNTER — Ambulatory Visit: Payer: 59 | Admitting: Internal Medicine

## 2013-07-28 ENCOUNTER — Telehealth: Payer: Self-pay | Admitting: Internal Medicine

## 2013-07-28 NOTE — Telephone Encounter (Signed)
States appt was rs 2/17 due to the weather.  R/s to 3/24.  Pt can only come after 2 p.m. on Mondays or 5:30 or later on Tuesdays.  Pt had old prescription of glipizide left over so he started taking that and now his glucose has dropped tremendously and is doing well. States he would like a new script for glipizide.  [facility] [address]  This will help him make it to appt 3/24.

## 2013-07-28 NOTE — Telephone Encounter (Signed)
Fwd to Dr. Dan HumphreysWalker. Please read below and advise.

## 2013-07-30 NOTE — Telephone Encounter (Signed)
We can work him in Monday afternoon, maybe 2:30pm?

## 2013-08-01 NOTE — Telephone Encounter (Signed)
Spoke with patient wife and she confirmed appointment for Monday at 230. Carollee HerterShannon, her schedule is not open for me to add this patient to her schedule, could you please put him on the schedule?

## 2013-08-04 MED ORDER — GLIPIZIDE 5 MG PO TABS: 2.5000 mg | ORAL_TABLET | Freq: Every day | ORAL | Status: DC

## 2013-08-04 NOTE — Telephone Encounter (Signed)
I have called in Rx

## 2013-08-04 NOTE — Telephone Encounter (Signed)
Spoke with wife after Okey RegalCarol tried giving patient an appointment for 08/12/13.  She was upset due to the patient being rescheduled due to weather or the provider out of the office. I explained to wife Tammy that we had no control over the weather and that Dr. Dan HumphreysWalker had been out sick some in fact she was wearing a mask today and not feeling great and that was the reason why the patient needed to be seen next Tuesday.  She states that they have been going to [facility] for diabetic education and he had started back on his glipizide 2.5mg  1 time a day and that his blood sugar readings have gone down since he started this medication again.  She would like [facility] day [facility] [address]. I have scheduled him an appointment for next Tuesday and advised wife that if we had to r/s due to weather we would get him in as quick as possible. Please advise if you can call in the glipizide so I can call her back to let her know it has been called in.

## 2013-08-04 NOTE — Telephone Encounter (Signed)
I called and advised Tammy that the medication had been called in.

## 2013-08-04 NOTE — Telephone Encounter (Signed)
Patient is coming in today at 2:30.

## 2013-08-04 NOTE — Telephone Encounter (Signed)
I didn't realize that we had no other pt scheduled for afternoon. Can we ask him to come Tuesday 3/10 instead. Anytime fine that day.

## 2013-08-12 ENCOUNTER — Ambulatory Visit: Payer: 59 | Admitting: Internal Medicine

## 2013-08-18 ENCOUNTER — Telehealth: Payer: Self-pay | Admitting: Internal Medicine

## 2013-08-18 NOTE — Telephone Encounter (Signed)
Left message on patient voicemail informing him he could take 1/2 tablet daily until his appointment with Dr. Dan HumphreysWalker then go from there. If any other questions or concerns then feel free to call the office back.

## 2013-08-18 NOTE — Telephone Encounter (Signed)
Pt came in checking on his appointment he has an appointment 3/24 He stated dr walker called in glipizide  Pt stated he picked up glipizied 5mg  but the meds should of been glipizide 2.5 xl  Please send in 2.5 glipizide 2.[facility] xl [facility] [address] Please advise when rx is called in

## 2013-08-20 NOTE — Telephone Encounter (Signed)
Patient did not return call, note was closed.

## 2013-08-25 ENCOUNTER — Telehealth: Payer: Self-pay | Admitting: Internal Medicine

## 2013-08-25 NOTE — Telephone Encounter (Signed)
He no-showed for his visit? He needs to make a 30min follow up. We can see him Wednesday morning.

## 2013-08-25 NOTE — Telephone Encounter (Signed)
Ok to change the medication?

## 2013-08-25 NOTE — Telephone Encounter (Signed)
The patient is needing glipiZIDE XL (GLUCOTROL) 2.5 MG tablet sent to the pharmacy . He stated that is what he requested when he was in the office.

## 2013-08-26 ENCOUNTER — Ambulatory Visit: Payer: 59 | Admitting: Internal Medicine

## 2013-08-28 NOTE — Telephone Encounter (Signed)
Spoke with patient, he will discuss when he comes in for his visit next week.

## 2013-09-02 ENCOUNTER — Ambulatory Visit: Payer: 59 | Admitting: Internal Medicine

## 2013-12-16 ENCOUNTER — Telehealth: Payer: Self-pay | Admitting: *Deleted

## 2013-12-16 NOTE — Telephone Encounter (Signed)
Chart reviewed for diabetic bundle. Pt not seen since 01/31/13, overdue for labs and OV. Sent mychart message on need for fasting labs and follow up appt.

## 2013-12-17 ENCOUNTER — Telehealth: Payer: Self-pay | Admitting: *Deleted

## 2013-12-17 NOTE — Telephone Encounter (Signed)
Faxed cardiac clearance to [facility]

## 2013-12-18 NOTE — Telephone Encounter (Signed)
Mailed unread message to pt  

## 2014-01-09 ENCOUNTER — Telehealth: Payer: Self-pay | Admitting: *Deleted

## 2014-01-09 NOTE — Telephone Encounter (Signed)
Chart reviewed, due for labs and appointment. Line busy, will try back. No appt scheduled after [facility]/letter mailed last month

## 2014-02-02 ENCOUNTER — Ambulatory Visit: Payer: Self-pay | Admitting: Unknown Physician Specialty

## 2014-02-03 LAB — PATHOLOGY REPORT

## 2014-03-16 ENCOUNTER — Encounter: Payer: Self-pay | Admitting: Cardiovascular Disease

## 2014-03-16 ENCOUNTER — Ambulatory Visit (INDEPENDENT_AMBULATORY_CARE_PROVIDER_SITE_OTHER): Payer: 59 | Admitting: Cardiovascular Disease

## 2014-03-16 VITALS — BP 154/92 | HR 62 | Ht 74.0 in | Wt 279.2 lb

## 2014-03-16 DIAGNOSIS — I2510 Atherosclerotic heart disease of native coronary artery without angina pectoris: Secondary | ICD-10-CM

## 2014-03-16 DIAGNOSIS — I10 Essential (primary) hypertension: Secondary | ICD-10-CM

## 2014-03-16 DIAGNOSIS — E7800 Pure hypercholesterolemia, unspecified: Secondary | ICD-10-CM

## 2014-03-16 MED ORDER — POTASSIUM CHLORIDE CRYS ER 20 MEQ PO TBCR: 20.0000 meq | EXTENDED_RELEASE_TABLET | Freq: Every day | ORAL | Status: DC

## 2014-03-16 NOTE — Progress Notes (Signed)
Primary care physician: Dr. Daniel NonesBert Klein  HPI  This is a 53 year old man who is here today for a followup visit. He is known to have coronary artery disease with 2 previous stents in 2006, refractory hypertension without evidence of renal artery stenosis and hospital admission in 2013 for acute renal failure due to volume depletion.  Most recent cardiac catheterization in 01/2012  showed : Left main 20% stenosis, LAD 40% stenosis, 70% ostial disease in a small diagonal and OM 2, patent stent in OM 3 and mid RCA without significant restenosis, 85% stenosis in the distal RCA which was treated with a 2.5 x 12 mm DES, occluded posterior AV groove with good collaterals(chronic), EF 45%. He has intolerance to multiple statins due to myalgia. He also has previous history of sleep apnea but had corrective surgery done with no evidence of recurrence.  Most recent nuclear stress test in June of 2014 showed no evidence of ischemia with mildly reduced systolic function. He continues to chew tobacco. He could not quit. He has been doing reasonably well and denies any chest pain or dyspnea.  He had colonoscopy done with removal of 2 polyps. He switched primary care physician from Dr. Dan HumphreysWalker to Dr. Graciela HusbandsKlein.    Allergies  Allergen Reactions  . Lipitor [Atorvastatin] Other (See Comments)    Joint pain, muscle aches     Current Outpatient Prescriptions on File Prior to Visit  Medication Sig Dispense Refill  . aspirin 81 MG tablet Take 81 mg by mouth daily.      . clopidogrel (PLAVIX) 75 MG tablet Take 1 tablet (75 mg total) by mouth daily.  90 tablet  3  . glipiZIDE (GLUCOTROL) 5 MG tablet Take 0.5 tablets (2.5 mg total) by mouth daily before breakfast.  30 tablet  3  . hydrochlorothiazide (HYDRODIURIL) 25 MG tablet Take 1 tablet (25 mg total) by mouth daily.  90 tablet  3  . isosorbide mononitrate (IMDUR) 30 MG 24 hr tablet Take 1 tablet (30 mg total) by mouth daily.  90 tablet  3  . lisinopril  (PRINIVIL,ZESTRIL) 40 MG tablet Take 1 tablet (40 mg total) by mouth daily.  90 tablet  3  . nitroGLYCERIN (NITROSTAT) 0.4 MG SL tablet Place 1 tablet (0.4 mg total) under the tongue every 5 (five) minutes as needed for chest pain.  25 tablet  6  . simvastatin (ZOCOR) 10 MG tablet Take 1 tablet (10 mg total) by mouth every evening.  90 tablet  3   No current facility-administered medications on file prior to visit.     Past Medical History  Diagnosis Date  . Unspecified gastritis and gastroduodenitis without mention of hemorrhage   . Sebaceous cyst     multiple on face, scalp, and trunk (side, left) per Dr Juanetta GoslingHawkins 09/25/11, Referral to [facility]  . DM type 2 (diabetes mellitus, type 2) 1998    non-insulin depedent  . Hypercholesterolemia   . HTN (hypertension)     since he was 53 y/o ?!!  . Sleep apnea     s/p ENT surgery more than 10 years ago.  . Cyst 2013    Removed from scalp & eye  . Acute kidney failure 2013  . Kidney failure   . Coronary artery disease     a. Two [place] stents in [place] in 2006 b. [facility]: LM 20% stenosis, LAD 40% stenosis, 70% ostial disease in small diagonal and OM2, patent OM3 stent and mid  RCA without significant restenosis, 85% stenosis distal RCA s/p 2.5 x 12 mm DES, posterior AV groove CTO with good collaterals; EF 45% c. Lexiscan Myoview 11/2012 low risk, no reversible ischemic changes; EF 45%  . Ischemic cardiomyopathy 01/2012    EF 45%  . Cardiomyopathy     Mixed hypertensive & ischemic     Past Surgical History  Procedure Laterality Date  . Coronary stent placement  2002, 2006    coronary artery stent  . Acne cyst removal  2013    x3  . Cardiac catheterization  2013    @ [facility]  . Uvulopalatopharyngoplasty (uppp)/tonsillectomy/septoplasty  2000    for sleep apnea     Family History  Problem Relation Age of Onset  . Heart disease Father   . Heart disease Brother   . Cancer Maternal Grandfather      lung     History   Social History  . Marital Status: Married    Spouse Name: N/A    Number of Children: 1  . Years of Education: 12th grade   Occupational History  . paper delivery    Social History Main Topics  . Smoking status: Never Smoker   . Smokeless tobacco: Current User    Types: Chew  . Alcohol Use: Yes     Comment: once or twice weekly  . Drug Use: No  . Sexual Activity: Not on file   Other Topics Concern  . Not on file   Social History Narrative   Lives at home with his wife. Cat in home. From [place].      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 He appears well-developed and well-nourished. No distress.  HENT: No nasal discharge.  Head: Normocephalic and atraumatic.  Eyes: Pupils are equal and round. Right eye exhibits no discharge. Left eye exhibits no discharge.  Neck: Normal range of motion. Neck supple. No JVD present. No thyromegaly present.  Cardiovascular: Normal rate, regular rhythm, normal heart sounds and. Exam reveals no gallop and no friction rub. No murmur heard.  Pulmonary/Chest: Effort normal and breath sounds normal. No stridor. No respiratory distress. He has no wheezes. He has no rales. He exhibits no tenderness.  Abdominal: Soft. Bowel sounds are normal. He exhibits no distension. There is no tenderness. There is no rebound and no guarding.  Musculoskeletal: Normal range of motion. He exhibits no edema and no tenderness.  Neurological: He is alert and oriented to person, place, and time. Coordination normal.  Skin: Skin is warm and dry. No rash noted. He is not diaphoretic. No erythema. No pallor.  Psychiatric: He has a normal mood and affect. His behavior is normal. Judgment and thought content normal.   EKG: Sinus   Rhythm  -Intraventricular conduction defect and left axis -possible anterior fascicular block   consider ventricular hypertrophy.   -  Nonspecific T-abnormality.   ABNORMAL   ASSESSMENT AND PLAN

## 2014-03-16 NOTE — Patient Instructions (Signed)
Your potassium has been refilled   Your physician wants you to follow-up in: 6 months. You will receive a reminder letter in the mail two months in advance. If you don't receive a letter, please call our office to schedule the follow-up appointment.

## 2014-03-16 NOTE — Assessment & Plan Note (Signed)
He is stable from a cardiac standpoint with no symptoms suggestive of angina. Continue medical therapy. 

## 2014-03-16 NOTE — Assessment & Plan Note (Signed)
Blood pressure is elevated today but he was rushing to get here. Continue to monitor and consider adding amlodipine. Should probably avoid a beta blocker due to a relatively low resting heart rate.

## 2014-03-16 NOTE — Assessment & Plan Note (Signed)
Most recent lipid profile showed an LDL of 104 which is reasonable. Ideally, LDL should be less than 70. However, he has known history of intolerance to higher doses of statins due to myalgia.

## 2014-07-07 ENCOUNTER — Ambulatory Visit: Payer: Self-pay | Admitting: Nephrology

## 2014-07-07 LAB — URINALYSIS, COMPLETE
BACTERIA: NONE SEEN
BILIRUBIN, UR: NEGATIVE
Blood: NEGATIVE
GLUCOSE, UR: NEGATIVE mg/dL (ref 0–75)
KETONE: NEGATIVE
Leukocyte Esterase: NEGATIVE
Nitrite: NEGATIVE
Ph: 6 (ref 4.5–8.0)
SPECIFIC GRAVITY: 1.017 (ref 1.003–1.030)
Squamous Epithelial: NONE SEEN

## 2014-07-07 LAB — CBC WITH DIFFERENTIAL/PLATELET
Basophil #: 0 10*3/uL (ref 0.0–0.1)
Basophil %: 0.7 %
Eosinophil #: 0.1 10*3/uL (ref 0.0–0.7)
Eosinophil %: 1.9 %
HCT: 43.4 % (ref 40.0–52.0)
HGB: 14.8 g/dL (ref 13.0–18.0)
LYMPHS PCT: 32.2 %
Lymphocyte #: 1.5 10*3/uL (ref 1.0–3.6)
MCH: 30 pg (ref 26.0–34.0)
MCHC: 34.1 g/dL (ref 32.0–36.0)
MCV: 88 fL (ref 80–100)
Monocyte #: 0.5 x10 3/mm (ref 0.2–1.0)
Monocyte %: 10.5 %
Neutrophil #: 2.6 10*3/uL (ref 1.4–6.5)
Neutrophil %: 54.7 %
Platelet: 253 10*3/uL (ref 150–440)
RBC: 4.93 10*6/uL (ref 4.40–5.90)
RDW: 13.3 % (ref 11.5–14.5)
WBC: 4.8 10*3/uL (ref 3.8–10.6)

## 2014-07-07 LAB — COMPREHENSIVE METABOLIC PANEL
ALK PHOS: 83 U/L (ref 46–116)
ALT: 23 U/L (ref 14–63)
AST: 26 U/L (ref 15–37)
Albumin: 3.2 g/dL — ABNORMAL LOW (ref 3.4–5.0)
Anion Gap: 8 (ref 7–16)
BILIRUBIN TOTAL: 0.7 mg/dL (ref 0.2–1.0)
BUN: 14 mg/dL (ref 7–18)
CO2: 28 mmol/L (ref 21–32)
CREATININE: 1.07 mg/dL (ref 0.60–1.30)
Calcium, Total: 8.8 mg/dL (ref 8.5–10.1)
Chloride: 102 mmol/L (ref 98–107)
EGFR (African American): >60
Glucose: 192 mg/dL — ABNORMAL HIGH (ref 65–99)
Osmolality: 281 (ref 275–301)
Potassium: 3.3 mmol/L — ABNORMAL LOW (ref 3.5–5.1)
Sodium: 138 mmol/L (ref 136–145)
Total Protein: 7.6 g/dL (ref 6.4–8.2)

## 2014-07-07 LAB — PROTIME-INR
INR: 1.1
Prothrombin Time: 13.9 secs

## 2014-07-07 LAB — PROTEIN / CREATININE RATIO, URINE
Creatinine, Urine: 179.3 mg/dL — ABNORMAL HIGH (ref 30.0–125.0)
Protein, Random Urine: 363 mg/dL — ABNORMAL HIGH (ref 0–12)
Protein/Creat. Ratio: 2025 mg/gCREAT — ABNORMAL HIGH (ref 0–200)

## 2014-07-07 LAB — APTT: ACTIVATED PTT: 29.2 s (ref 23.6–35.9)

## 2014-07-09 ENCOUNTER — Observation Stay: Payer: Self-pay | Admitting: Nephrology

## 2014-07-09 LAB — COMPREHENSIVE METABOLIC PANEL
ALT: 22 U/L (ref 14–63)
ANION GAP: 8 (ref 7–16)
AST: 22 U/L (ref 15–37)
Albumin: 3.2 g/dL — ABNORMAL LOW (ref 3.4–5.0)
Alkaline Phosphatase: 72 U/L (ref 46–116)
BILIRUBIN TOTAL: 0.7 mg/dL (ref 0.2–1.0)
BUN: 14 mg/dL (ref 7–18)
CHLORIDE: 105 mmol/L (ref 98–107)
CO2: 29 mmol/L (ref 21–32)
CREATININE: 1.01 mg/dL (ref 0.60–1.30)
Calcium, Total: 8.7 mg/dL (ref 8.5–10.1)
EGFR (African American): >60
EGFR (Non-African Amer.): >60
Glucose: 161 mg/dL — ABNORMAL HIGH (ref 65–99)
Osmolality: 287 (ref 275–301)
Potassium: 3.4 mmol/L — ABNORMAL LOW (ref 3.5–5.1)
Sodium: 142 mmol/L (ref 136–145)
TOTAL PROTEIN: 7.3 g/dL (ref 6.4–8.2)

## 2014-07-09 LAB — HEMOGLOBIN: HGB: 15.5 g/dL (ref 13.0–18.0)

## 2014-07-09 LAB — URINALYSIS, COMPLETE
BLOOD: NEGATIVE
Bacteria: NONE SEEN
Bilirubin,UR: NEGATIVE
Glucose,UR: NEGATIVE mg/dL (ref 0–75)
Ketone: NEGATIVE
Leukocyte Esterase: NEGATIVE
NITRITE: NEGATIVE
Ph: 5 (ref 4.5–8.0)
RBC,UR: 1 /HPF (ref 0–5)
Specific Gravity: 1.016 (ref 1.003–1.030)
Squamous Epithelial: NONE SEEN

## 2014-07-09 LAB — CBC WITH DIFFERENTIAL/PLATELET
Basophil #: 0 10*3/uL (ref 0.0–0.1)
Basophil %: 1 %
EOS ABS: 0.1 10*3/uL (ref 0.0–0.7)
EOS PCT: 2.6 %
HCT: 42.9 % (ref 40.0–52.0)
HGB: 14.7 g/dL (ref 13.0–18.0)
LYMPHS PCT: 33.3 %
Lymphocyte #: 1.6 10*3/uL (ref 1.0–3.6)
MCH: 30.2 pg (ref 26.0–34.0)
MCHC: 34.4 g/dL (ref 32.0–36.0)
MCV: 88 fL (ref 80–100)
Monocyte #: 0.6 x10 3/mm (ref 0.2–1.0)
Monocyte %: 13.5 %
NEUTROS ABS: 2.3 10*3/uL (ref 1.4–6.5)
Neutrophil %: 49.6 %
Platelet: 226 10*3/uL (ref 150–440)
RBC: 4.87 10*6/uL (ref 4.40–5.90)
RDW: 13.2 % (ref 11.5–14.5)
WBC: 4.7 10*3/uL (ref 3.8–10.6)

## 2014-07-09 LAB — PROTIME-INR
INR: 1.1
PROTHROMBIN TIME: 14.4 s

## 2014-07-09 LAB — HEMOGLOBIN A1C: Hemoglobin A1C: 6.3 % (ref 4.2–6.3)

## 2014-07-09 LAB — PROTEIN / CREATININE RATIO, URINE
Creatinine, Urine: 201.7 mg/dL — ABNORMAL HIGH (ref 30.0–125.0)
PROTEIN/CREAT. RATIO: 1904 mg/g{creat} — AB (ref 0–200)
Protein, Random Urine: 384 mg/dL — ABNORMAL HIGH (ref 0–12)

## 2014-07-10 LAB — CBC WITH DIFFERENTIAL/PLATELET
BASOS ABS: 0 10*3/uL (ref 0.0–0.1)
BASOS PCT: 0.6 %
EOS ABS: 0.2 10*3/uL (ref 0.0–0.7)
Eosinophil %: 2.8 %
HCT: 42.4 % (ref 40.0–52.0)
HGB: 14.9 g/dL (ref 13.0–18.0)
LYMPHS ABS: 2.7 10*3/uL (ref 1.0–3.6)
Lymphocyte %: 39.6 %
MCH: 30.5 pg (ref 26.0–34.0)
MCHC: 35 g/dL (ref 32.0–36.0)
MCV: 87 fL (ref 80–100)
Monocyte #: 0.8 x10 3/mm (ref 0.2–1.0)
Monocyte %: 12.2 %
NEUTROS ABS: 3.1 10*3/uL (ref 1.4–6.5)
Neutrophil %: 44.8 %
Platelet: 223 10*3/uL (ref 150–440)
RBC: 4.88 10*6/uL (ref 4.40–5.90)
RDW: 13.1 % (ref 11.5–14.5)
WBC: 6.8 10*3/uL (ref 3.8–10.6)

## 2014-07-10 LAB — BASIC METABOLIC PANEL
Anion Gap: 7 (ref 7–16)
BUN: 13 mg/dL (ref 7–18)
CHLORIDE: 104 mmol/L (ref 98–107)
CO2: 29 mmol/L (ref 21–32)
Calcium, Total: 8.5 mg/dL (ref 8.5–10.1)
Creatinine: 1.08 mg/dL (ref 0.60–1.30)
EGFR (African American): >60
EGFR (Non-African Amer.): >60
Glucose: 125 mg/dL — ABNORMAL HIGH (ref 65–99)
Osmolality: 281 (ref 275–301)
POTASSIUM: 3.2 mmol/L — AB (ref 3.5–5.1)
Sodium: 140 mmol/L (ref 136–145)

## 2014-07-10 LAB — URINALYSIS, COMPLETE
Bacteria: NONE SEEN
Bilirubin,UR: NEGATIVE
Glucose,UR: NEGATIVE mg/dL (ref 0–75)
Ketone: NEGATIVE
Leukocyte Esterase: NEGATIVE
NITRITE: NEGATIVE
Ph: 5 (ref 4.5–8.0)
RBC,UR: 14 /HPF (ref 0–5)
SPECIFIC GRAVITY: 1.014 (ref 1.003–1.030)
Squamous Epithelial: NONE SEEN
WBC UR: <1 /HPF (ref 0–5)

## 2014-09-28 LAB — SURGICAL PATHOLOGY

## 2014-12-31 ENCOUNTER — Encounter: Payer: Self-pay | Admitting: Cardiovascular Disease

## 2014-12-31 ENCOUNTER — Ambulatory Visit (INDEPENDENT_AMBULATORY_CARE_PROVIDER_SITE_OTHER): Payer: 59 | Admitting: Cardiovascular Disease

## 2014-12-31 VITALS — BP 122/78 | HR 54 | Ht 74.0 in | Wt 278.0 lb

## 2014-12-31 DIAGNOSIS — E7800 Pure hypercholesterolemia, unspecified: Secondary | ICD-10-CM

## 2014-12-31 DIAGNOSIS — I2510 Atherosclerotic heart disease of native coronary artery without angina pectoris: Secondary | ICD-10-CM | POA: Diagnosis not present

## 2014-12-31 DIAGNOSIS — I10 Essential (primary) hypertension: Secondary | ICD-10-CM

## 2014-12-31 MED ORDER — SIMVASTATIN 10 MG PO TABS: 10.0000 mg | ORAL_TABLET | Freq: Every evening | ORAL | Status: AC

## 2014-12-31 MED ORDER — POTASSIUM CHLORIDE CRYS ER 20 MEQ PO TBCR: 20.0000 meq | EXTENDED_RELEASE_TABLET | Freq: Every day | ORAL | Status: AC

## 2014-12-31 MED ORDER — AMLODIPINE BESYLATE 10 MG PO TABS: 10.0000 mg | ORAL_TABLET | Freq: Every day | ORAL | Status: AC

## 2014-12-31 MED ORDER — ISOSORBIDE MONONITRATE ER 60 MG PO TB24: 60.0000 mg | ORAL_TABLET | Freq: Every day | ORAL | Status: AC

## 2014-12-31 MED ORDER — CARVEDILOL 25 MG PO TABS: 25.0000 mg | ORAL_TABLET | Freq: Two times a day (BID) | ORAL | Status: AC

## 2014-12-31 MED ORDER — HYDROCHLOROTHIAZIDE 25 MG PO TABS: 25.0000 mg | ORAL_TABLET | Freq: Every day | ORAL | Status: AC

## 2014-12-31 NOTE — Progress Notes (Signed)
Primary care physician: Dr. Daniel Nones  HPI  This is a 54 year old man who is here today for a followup visit. He is known to have coronary artery disease with 2 previous stents in 2006, refractory hypertension without evidence of renal artery stenosis and hospital admission in 2013 for acute renal failure due to volume depletion.  Most recent cardiac catheterization in 01/2012  showed : Left main 20% stenosis, LAD 40% stenosis, 70% ostial disease in a small diagonal and OM 2, patent stent in OM 3 and mid RCA without significant restenosis, 85% stenosis in the distal RCA which was treated with a 2.5 x 12 mm DES, occluded posterior AV groove with good collaterals(chronic), EF 45%. He has intolerance to multiple statins due to myalgia. He also has previous history of sleep apnea but had corrective surgery done with no evidence of recurrence.  Most recent nuclear stress test in June of 2014 showed no evidence of ischemia with mildly reduced systolic function. He  chews tobacco.  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 He has been on prednisone since February and is being followed by Dr. Deetta Perla. Due to elevated blood pressure, multiple medications were provided most recently diltiazem. He is noted to be bradycardic today. No chest pain or shortness of breath.  Allergies  Allergen Reactions  . Lipitor [Atorvastatin] Other (See Comments)    Joint pain, muscle aches     Current Outpatient Prescriptions on File Prior to Visit  Medication Sig Dispense Refill  . aspirin 81 MG tablet Take 81 mg by mouth daily.    . clopidogrel (PLAVIX) 75 MG tablet Take 1 tablet (75 mg total) by mouth daily. 90 tablet 3  . lisinopril (PRINIVIL,ZESTRIL) 40 MG tablet Take 1 tablet (40 mg total) by mouth daily. 90 tablet 3  . metFORMIN (GLUCOPHAGE) 500 MG tablet Take 500 mg by mouth 2 (two) times daily with a meal.    . nitroGLYCERIN (NITROSTAT) 0.4 MG SL  tablet Place 1 tablet (0.4 mg total) under the tongue every 5 (five) minutes as needed for chest pain. 25 tablet 6   No current facility-administered medications on file prior to visit.     Past Medical History  Diagnosis Date  . Unspecified gastritis and gastroduodenitis without mention of hemorrhage   . Sebaceous cyst     multiple on face, scalp, and trunk (side, left) per Dr Juanetta Gosling 09/25/11, Referral to [facility]  . DM type 2 (diabetes mellitus, type 2) 1998    non-insulin depedent  . Hypercholesterolemia   . HTN (hypertension)     since he was 54 y/o ?!!  . Sleep apnea     s/p ENT surgery more than 10 years ago.  . Cyst 2013    Removed from scalp & eye  . Acute kidney failure 2013  . Kidney failure   . Coronary artery disease     a. Two previous stents in [place] in 2006 b. Cardiac cath 01/2012 at [facility]: LM 20% stenosis, LAD 40% stenosis, 70% ostial disease in small diagonal and OM2, patent OM3 stent and mid RCA without significant restenosis, 85% stenosis distal RCA s/p 2.5 x 12 mm DES, posterior AV groove CTO with good collaterals; EF 45% c. Lexiscan Myoview 11/2012 low risk, no reversible ischemic changes; EF 45%  . Ischemic cardiomyopathy 01/2012    EF 45%  . Cardiomyopathy     Mixed hypertensive & ischemic     Past  Surgical History  Procedure Laterality Date  . Coronary stent placement  2002, 2006    coronary artery stent  . Acne cyst removal  2013    x3  . Cardiac catheterization  2013    @ [facility]  . Uvulopalatopharyngoplasty (uppp)/tonsillectomy/septoplasty  2000    for sleep apnea     Family History  Problem Relation Age of Onset  . Heart disease Father   . Heart disease Brother   . Cancer Maternal Grandfather     lung     History   Social History  . Marital Status: Married    Spouse Name: N/A  . Number of Children: 1  . Years of Education: 12th grade   Occupational History  . paper delivery    Social History Main Topics  . Smoking  status: Never Smoker   . Smokeless tobacco: Current User    Types: Chew  . Alcohol Use: Yes     Comment: once or twice weekly  . Drug Use: No  . Sexual Activity: Not on file   Other Topics Concern  . Not on file   Social History Narrative   Lives at home with his wife. Cat in home. From [place].      Work - Surveyor, minerals for office supplies      Diet - regular      Exercise - walks at work     PHYSICAL EXAM   BP 122/78 mmHg  Pulse 54  Ht  (1.88 m)  Wt 278 lb (126.1 kg)  BMI 35.68 kg/m2 Constitutional: He is oriented to person, place, and time. He appears well-developed and well-nourished. No distress.  HENT: No nasal discharge.  Head: Normocephalic and atraumatic.  Eyes: Pupils are equal and round. Right eye exhibits no discharge. Left eye exhibits no discharge.  Neck: Normal range of motion. Neck supple. No JVD present. No thyromegaly present.  Cardiovascular: Normal rate, regular rhythm, normal heart sounds and. Exam reveals no gallop and no friction rub. There is one out of 6 systolic ejection murmur in the aortic area.  Pulmonary/Chest: Effort normal and breath sounds normal. No stridor. No respiratory distress. He has no wheezes. He has no rales. He exhibits no tenderness.  Abdominal: Soft. Bowel sounds are normal. He exhibits no distension. There is no tenderness. There is no rebound and no guarding.  Musculoskeletal: Normal range of motion. He exhibits no edema and no tenderness.  Neurological: He is alert and oriented to person, place, and time. Coordination normal.  Skin: Skin is warm and dry. No rash noted. He is not diaphoretic. No erythema. No pallor.  Psychiatric: He has a normal mood and affect. His behavior is normal. Judgment and thought content normal.   EKG: Sinus  Bradycardia  -Intraventricular conduction defect -consider left ventricular hypertrophy.   -  Nonspecific T-abnormality.   ABNORMAL     ASSESSMENT AND PLAN

## 2014-12-31 NOTE — Patient Instructions (Signed)
Medication Instructions:  Your physician has recommended you make the following change in your medication:  STOP taking diltiazem   Labwork: none  Testing/Procedures: none  Follow-Up: Your physician wants you to follow-up in: six months with Dr. Kirke Corin.  You will receive a reminder letter in the mail two months in advance. If you don't receive a letter, please call our office to schedule the follow-up appointment.   Any Other Special Instructions Will Be Listed Below (If Applicable).

## 2014-12-31 NOTE — Assessment & Plan Note (Signed)
His blood pressure is reasonably controlled now but he is bradycardic. He is on 2 calcium channel blockers including amlodipine and diltiazem.  he is also on high dose carvedilol. due to that, I discontinued diltiazem in order to avoid further bradycardia and drug interactions.

## 2014-12-31 NOTE — Assessment & Plan Note (Signed)
He is intolerant to multiple statins but has been able to take small dose simvastatin.

## 2014-12-31 NOTE — Assessment & Plan Note (Signed)
He has no symptoms of angina. Continue medical therapy. 

## 2015-03-29 ENCOUNTER — Encounter: Payer: Self-pay | Admitting: Nephrology

## 2015-07-02 ENCOUNTER — Other Ambulatory Visit: Payer: Self-pay | Admitting: Cardiovascular Disease

## 2015-07-05 ENCOUNTER — Ambulatory Visit: Payer: 59 | Admitting: Cardiovascular Disease

## 2015-07-07 DEATH — deceased

## 2015-07-08 ENCOUNTER — Other Ambulatory Visit: Payer: Self-pay | Admitting: Cardiovascular Disease
